# Patient Record
Sex: Female | Born: 1980 | Race: Black or African American | Hispanic: No | Marital: Married | Smoking: Never smoker
Health system: Southern US, Community
[De-identification: ages and names within clinical notes are randomized; demographics above are authoritative.]

## PROBLEM LIST (undated history)

## (undated) ENCOUNTER — Inpatient Hospital Stay (HOSPITAL_COMMUNITY): Payer: Self-pay

## (undated) DIAGNOSIS — K900 Celiac disease: Secondary | ICD-10-CM

## (undated) DIAGNOSIS — I10 Essential (primary) hypertension: Secondary | ICD-10-CM

## (undated) DIAGNOSIS — O24419 Gestational diabetes mellitus in pregnancy, unspecified control: Secondary | ICD-10-CM

## (undated) DIAGNOSIS — K219 Gastro-esophageal reflux disease without esophagitis: Secondary | ICD-10-CM

## (undated) DIAGNOSIS — K589 Irritable bowel syndrome without diarrhea: Secondary | ICD-10-CM

## (undated) DIAGNOSIS — M419 Scoliosis, unspecified: Secondary | ICD-10-CM

## (undated) DIAGNOSIS — N83209 Unspecified ovarian cyst, unspecified side: Secondary | ICD-10-CM

## (undated) DIAGNOSIS — Q270 Congenital absence and hypoplasia of umbilical artery: Secondary | ICD-10-CM

## (undated) DIAGNOSIS — N883 Incompetence of cervix uteri: Secondary | ICD-10-CM

## (undated) DIAGNOSIS — J45909 Unspecified asthma, uncomplicated: Secondary | ICD-10-CM

## (undated) DIAGNOSIS — D219 Benign neoplasm of connective and other soft tissue, unspecified: Secondary | ICD-10-CM

## (undated) DIAGNOSIS — G43909 Migraine, unspecified, not intractable, without status migrainosus: Secondary | ICD-10-CM

## (undated) HISTORY — DX: Gestational diabetes mellitus in pregnancy, unspecified control: O24.419

## (undated) HISTORY — DX: Congenital absence and hypoplasia of umbilical artery: Q27.0

## (undated) HISTORY — DX: Scoliosis, unspecified: M41.9

## (undated) HISTORY — DX: Celiac disease: K90.0

## (undated) HISTORY — DX: Gastro-esophageal reflux disease without esophagitis: K21.9

## (undated) HISTORY — DX: Benign neoplasm of connective and other soft tissue, unspecified: D21.9

## (undated) HISTORY — PX: NO PAST SURGERIES: SHX2092

## (undated) HISTORY — DX: Essential (primary) hypertension: I10

## (undated) HISTORY — DX: Irritable bowel syndrome, unspecified: K58.9

## (undated) HISTORY — DX: Unspecified asthma, uncomplicated: J45.909

## (undated) HISTORY — DX: Migraine, unspecified, not intractable, without status migrainosus: G43.909

## (undated) HISTORY — DX: Unspecified ovarian cyst, unspecified side: N83.209

---

## 2004-02-10 ENCOUNTER — Emergency Department (HOSPITAL_COMMUNITY): Admission: EM | Admit: 2004-02-10 | Discharge: 2004-02-10 | Payer: Self-pay | Admitting: Emergency Medicine

## 2004-02-14 ENCOUNTER — Emergency Department (HOSPITAL_COMMUNITY): Admission: EM | Admit: 2004-02-14 | Discharge: 2004-02-14 | Payer: Self-pay | Admitting: Emergency Medicine

## 2004-04-02 ENCOUNTER — Emergency Department (HOSPITAL_COMMUNITY): Admission: EM | Admit: 2004-04-02 | Discharge: 2004-04-02 | Payer: Self-pay | Admitting: Emergency Medicine

## 2004-04-05 ENCOUNTER — Emergency Department (HOSPITAL_COMMUNITY): Admission: EM | Admit: 2004-04-05 | Discharge: 2004-04-05 | Payer: Self-pay | Admitting: Emergency Medicine

## 2004-08-22 ENCOUNTER — Emergency Department (HOSPITAL_COMMUNITY): Admission: EM | Admit: 2004-08-22 | Discharge: 2004-08-22 | Payer: Self-pay | Admitting: Emergency Medicine

## 2006-02-05 ENCOUNTER — Other Ambulatory Visit: Admission: RE | Admit: 2006-02-05 | Discharge: 2006-02-05 | Payer: Self-pay | Admitting: Obstetrics and Gynecology

## 2006-03-19 ENCOUNTER — Inpatient Hospital Stay (HOSPITAL_COMMUNITY): Admission: AD | Admit: 2006-03-19 | Discharge: 2006-03-19 | Payer: Self-pay | Admitting: Obstetrics and Gynecology

## 2006-03-20 ENCOUNTER — Inpatient Hospital Stay (HOSPITAL_COMMUNITY): Admission: AD | Admit: 2006-03-20 | Discharge: 2006-03-20 | Payer: Self-pay | Admitting: Obstetrics and Gynecology

## 2006-03-25 ENCOUNTER — Inpatient Hospital Stay (HOSPITAL_COMMUNITY): Admission: AD | Admit: 2006-03-25 | Discharge: 2006-03-28 | Payer: Self-pay | Admitting: Obstetrics and Gynecology

## 2006-04-24 ENCOUNTER — Inpatient Hospital Stay (HOSPITAL_COMMUNITY): Admission: AD | Admit: 2006-04-24 | Discharge: 2006-04-25 | Payer: Self-pay | Admitting: Obstetrics and Gynecology

## 2006-04-24 ENCOUNTER — Encounter (INDEPENDENT_AMBULATORY_CARE_PROVIDER_SITE_OTHER): Payer: Self-pay | Admitting: *Deleted

## 2006-12-31 ENCOUNTER — Emergency Department (HOSPITAL_COMMUNITY): Admission: EM | Admit: 2006-12-31 | Discharge: 2006-12-31 | Payer: Self-pay | Admitting: Emergency Medicine

## 2007-01-23 ENCOUNTER — Encounter (INDEPENDENT_AMBULATORY_CARE_PROVIDER_SITE_OTHER): Payer: Self-pay | Admitting: *Deleted

## 2007-01-23 ENCOUNTER — Observation Stay (HOSPITAL_COMMUNITY): Admission: AD | Admit: 2007-01-23 | Discharge: 2007-01-23 | Payer: Self-pay | Admitting: Obstetrics and Gynecology

## 2009-06-29 ENCOUNTER — Ambulatory Visit (HOSPITAL_COMMUNITY): Admission: RE | Admit: 2009-06-29 | Discharge: 2009-06-29 | Payer: Self-pay | Admitting: Obstetrics and Gynecology

## 2009-11-14 ENCOUNTER — Emergency Department (HOSPITAL_COMMUNITY): Admission: EM | Admit: 2009-11-14 | Discharge: 2009-11-14 | Payer: Self-pay | Admitting: Emergency Medicine

## 2010-02-15 ENCOUNTER — Emergency Department (HOSPITAL_COMMUNITY): Admission: EM | Admit: 2010-02-15 | Discharge: 2010-02-15 | Payer: Self-pay | Admitting: Emergency Medicine

## 2010-08-18 ENCOUNTER — Emergency Department (HOSPITAL_COMMUNITY): Admission: EM | Admit: 2010-08-18 | Discharge: 2010-08-18 | Payer: Self-pay | Admitting: Family Medicine

## 2011-01-08 ENCOUNTER — Encounter: Payer: Self-pay | Admitting: Obstetrics and Gynecology

## 2011-03-01 LAB — POCT I-STAT, CHEM 8
BUN: 10 mg/dL (ref 6–23)
Calcium, Ion: 1.14 mmol/L (ref 1.12–1.32)
Chloride: 106 mEq/L (ref 96–112)
Creatinine, Ser: 0.7 mg/dL (ref 0.4–1.2)
Glucose, Bld: 92 mg/dL (ref 70–99)
HCT: 45 % (ref 36.0–46.0)
Hemoglobin: 15.3 g/dL — ABNORMAL HIGH (ref 12.0–15.0)
Potassium: 3.6 mEq/L (ref 3.5–5.1)
Sodium: 139 mEq/L (ref 135–145)
TCO2: 25 mmol/L (ref 0–100)

## 2011-03-01 LAB — POCT PREGNANCY, URINE: Preg Test, Ur: NEGATIVE

## 2011-03-01 LAB — POCT URINALYSIS DIPSTICK
Bilirubin Urine: NEGATIVE
Glucose, UA: NEGATIVE mg/dL
Hgb urine dipstick: NEGATIVE
Ketones, ur: NEGATIVE mg/dL
Nitrite: NEGATIVE
Protein, ur: NEGATIVE mg/dL
Specific Gravity, Urine: 1.015 (ref 1.005–1.030)
Urobilinogen, UA: 0.2 mg/dL (ref 0.0–1.0)
pH: 7 (ref 5.0–8.0)

## 2011-03-21 LAB — RAPID STREP SCREEN (MED CTR MEBANE ONLY): Streptococcus, Group A Screen (Direct): POSITIVE — AB

## 2011-03-21 LAB — PREGNANCY, URINE: Preg Test, Ur: NEGATIVE

## 2011-05-04 NOTE — Discharge Summary (Signed)
NAME:  Kathryn Terry, Kathryn Terry NO.:  [id number]   MEDICAL RECORD NO.:  [id number]          PATIENT TYPE:  INP   LOCATION:  9149                          [facility]ILITY:  [facility]   PHYSICIAN:  Zenaida Niece, M.D.DATE OF BIRTH:  May 05, 1981   DATE OF ADMISSION:  03/25/2006  DATE OF DISCHARGE:  03/28/2006                                 DISCHARGE SUMMARY   ADMISSION DIAGNOSES:  1.  Intrauterine pregnancy at 25+ weeks.  2.  Preterm cervical dilation.   DISCHARGE DIAGNOSES:  1.  Intrauterine pregnancy at 25+ weeks.  2.  Preterm cervical dilation.   HISTORY AND PHYSICAL:  Please see chart for full history and physical, but,  briefly, this is a 30 year old black female gravida 1, para 0 with an EGA of  25+ weeks who was seen on March 19, 2006 and then examined by Dr. Senaida Ores.  At that time, she was 1 cm dilated, 30% effaced.  She was sent home on  bedrest and received betamethasone.  On speculum exam on the day of  admission, Dr. Ambrose Mantle was able to see membranes at the cervical os, and the  cervix was about 2 cm dilated, 50% effaced.   PAST MEDICAL HISTORY:  1.  History of irritable bowel syndrome.  2.  Gastroesophageal reflux disease.   PHYSICAL EXAMINATION:  The remainder is significant for a gravid abdomen  with a fundal height of 26 cm and normal fetal heart sounds.   HOSPITAL COURSE:  The patient was admitted and had an ultrasound which  revealed vertex presentation.  She was put on Procardia for tocolysis.  She  was having at least irritability on monitoring.  Fetal heart tracing  remained reassuring.  Urine culture was sent and returned negative.  Group B  strep was done in the office, and I do not have those results.  She was put  on IV penicillin initially, and this was stopped which she did not progress.  Her contractions quiesced.  On the morning of March 28, 2006, I did an exam  and the cervix was 1 cm dilated, fairly thick, and the presenting part was  high with  no membranes palpable anywhere in the cervix.  Due the fact that  she does not appear to be progressing, I feel she is stable enough to go  home.   PLAN:  The plan is to let her go home on modified bed rest and follow-up in  2 weeks.  The patient is to call for anything that changes.   DISCHARGE INSTRUCTIONS:  1.  Regular diet.  2.  Modified bed rest.  3.  Follow-up in 2 weeks.  4.  Call for problems.      Zenaida Niece, M.D.  Electronically Signed     TDM/MEDQ  D:  03/28/2006  T:  03/28/2006  Job:  [id number]

## 2011-05-04 NOTE — Discharge Summary (Signed)
NAME:  Kathryn Terry, Kathryn Terry NO.:  [id number]   MEDICAL RECORD NO.:  [id number]          PATIENT TYPE:  OBV   LOCATION:  9373                          [facility]   PHYSICIAN:  Zenaida Niece, M.D.DATE OF BIRTH:  09/15/1981   DATE OF ADMISSION:  01/22/2007  DATE OF DISCHARGE:  01/23/2007                               DISCHARGE SUMMARY   ADMISSION DIAGNOSIS:  Intrauterine pregnancy at 18 weeks with preterm  premature rupture of membranes and inevitable abortion.   DISCHARGE DIAGNOSIS:  Intrauterine pregnancy at 18 weeks with preterm  premature rupture of membranes and inevitable abortion.   PROCEDURES:  On 02/07, she had a prostaglandin-induced vaginal delivery  of a nonviable infant.   HISTORY AND PHYSICAL:  Please see chart for full history and physical.  Briefly, this is a 30 year old black female, gravida 2, para 0-1-0-1,  with an EGA of approximately 18 weeks who presented with the complaint  of lower abdominal cramping and pouch at the opening of her vagina that  ruptured and spilled bloody fluid after which she noticed something came  out.  On evaluation in triage, she had a 20-week size uterus, and  ultrasound confirmed an 18-week pregnancy with decreased amniotic fluid  with a living pregnancy in a breech presentation.   Physical exam was significant for the that she was afebrile with stable  vital signs.  Fundus was just above the umbilicus and is nontender.  Pelvic exam revealed membranes hanging 6 inches outside the vaginal  introitus.  There was a blood clot in the vagina.  Cervix was fingertip  open, and Dr. Ambrose Mantle could palpate a leg as the presenting part.   HOSPITAL COURSE:  The patient was admitted and had prostaglandin  suppositories placed for induction.  She rapidly progressed, and on the  morning of February 7, spontaneously passed the fetus.  She was then  started on Pitocin.  Shortly after that, she passed 150-100 mL of blood  with the  placenta.  Dr. Ambrose Mantle felt it appeared intact.  Exam revealed  the cervix to be open, but no tissue was felt.  She was given 2 doses of  IV Unasyn and remained afebrile.  On the evening of February7, she was  felt to be stable enough for discharge home.   DISCHARGE INSTRUCTIONS:  Regular diet, pelvic rest, no strenuous  activity, and she is to follow-up in 2 weeks with Dr. Ambrose Mantle.      Zenaida Niece, M.D.  Electronically Signed     TDM/MEDQ  D:  03/04/2007  T:  03/05/2007  Job:  [id number]

## 2011-05-04 NOTE — H&P (Signed)
NAME:  Kathryn Terry, Kathryn Terry NO.:  [id number]   MEDICAL RECORD NO.:  [id number]          PATIENT TYPE:  MAT   LOCATION:  MATC                          [facility]   PHYSICIAN:  Malachi Pro. Ambrose Mantle, M.D. DATE OF BIRTH:  [birth year]   DATE OF ADMISSION:  01/23/2007  DATE OF DISCHARGE:                              HISTORY & PHYSICAL   PRESENT ILLNESS:  This is a 30 year old black female, para 0-1-0-1,  gravida 2, admitted with rupture of membranes, vaginal bleeding and  inevitable abortion.  Last menstrual period, December 10, 2006.  The  patient's 1st pregnancy was complicated by premature cervical dilatation  at approximately 26 weeks; she received steroids and then at 30 weeks,  delivered at 3-pound 7-1/2-ounce female infant on Apr 24, 2006.  At her 6-  week checkup, she was given Nora-B for birth control.  She did have  Chlamydia during her 1st pregnancy.  She has not been back for any  further checkups and had no idea that she was pregnant.  During the  evening of January 22, 2007, the patient noted some cramping in her  lower abdomen, went to the restroom, noted a pouch at the opening of her  vagina that ruptured and spilled bloody fluid.  She then noticed  something hanging out; she came here for evaluation and was noted to  have a uterus the size of a 20-week pregnancy, which on ultrasound was  shown to be average age of 18 weeks 1 or 2 days with decreased amniotic  fluid, the largest pocket measuring 0.7 cm.  The pregnancy was living  and was a breech presentation.  I saw the patient and there were  probably 20 mL of blood clot in the vagina.  The cervix was a fingertip-  open.   ALLERGIES:  No known allergies.   OPERATION:  No operations.   PAST MEDICAL HISTORY:  No significant illnesses, although she did have  Chlamydia during her 1st pregnancy.   SOCIAL HISTORY:  Occasional alcohol, no tobacco or drugs.   FAMILY HISTORY:  The mother is 60 with diabetes and  high blood pressure.  Father died of drowning at an unknown age.  One brother and 1 sister are  apparently living and well.   OBSTETRICAL HISTORY:  Apr 24, 2006, a 3-pound 7-1/2-ounce female at 30  weeks.   PHYSICAL EXAMINATION:  VITAL SIGNS:  Temperature 98.9, pulse 116,  respirations 20, blood pressure 129/82.  GENERAL:  Well-developed, well-nourished black female in no distress.  EENT:  Normal.  BREASTS:  Soft without masses.  HEART:  Normal, sinus tachycardia, no murmurs.  LUNGS:  Clear to auscultation.  ABDOMEN:  Soft.  Fundal height is just above the umbilicus.  PELVIC:  The uterus is not tender.  Vulva and vagina show membranous  material hanging about 6 inches outside the vaginal introitus.  There is  blood clot in the vagina, approximately 20 mL.  The cervix is a  fingertip-open.  I can touch a presenting part that is a leg.  The  uterus is approximately 20-week size.   ADMITTING IMPRESSION:  Intrauterine pregnancy, 18+ weeks by ultrasound,  with ruptured membranes and inevitable abortion.   PLAN:  The patient is admitted for prostaglandin suppositories.  Cultures are taken of the cervix for GC and Chlamydia of the endometrium  for anaerobic and aerobic.      Malachi Pro. Ambrose Mantle, M.D.  Electronically Signed     TFH/MEDQ  D:  01/23/2007  T:  01/23/2007  Job:  [id number]

## 2011-05-04 NOTE — Discharge Summary (Signed)
NAME:  Kathryn Terry, CREASON NO.:  [id number]   MEDICAL RECORD NO.:  [id number]          PATIENT TYPE:  INP   LOCATION:  9113                          [facility]   PHYSICIAN:  Huel Cote, M.D. DATE OF BIRTH:  1981-02-23   DATE OF ADMISSION:  04/24/2006  DATE OF DISCHARGE:  04/25/2006                                 DISCHARGE SUMMARY   DISCHARGE DIAGNOSES:  1.  Preterm pregnancy at 30 and 2/7ths weeks, delivered.  2.  Status post spontaneous vaginal delivery.  3.  Preterm status of infant requiring the NICU care.   DISCHARGE MEDICATIONS:  Motrin 600 mg p.o. every 6 hours p.r.n.   DISCHARGE FOLLOWUP:  The patient is to follow-up in the office in 6 weeks  for her routine postpartum exam.   HOSPITAL COURSE:  The patient is a 30 year old G1 P0 who was admitted to 76  and 2/7ths weeks gestation, arriving in active labor with advanced cervical  dilation to 7-8 cm.  Prenatal care had been complicated by preterm dilation  with admission to the hospital at 25-week.  She did receive betamethasone  and had been placed on bedrest for these findings.   PRENATAL LABORATORIES:  A positive, antibody negative, sickle normal,  rubella immune, hepatitis B surface antigen negative, HIV negative, GC  negative, Chlamydia positive with a negative test of cure after treatment,  triple screen normal, group B strep unknown.  One-hour Glucola was normal,  per the patient.   PAST OBSTETRICAL HISTORY:  None.   PAST GYNECOLOGIC HISTORY:  The chlamydia, which was treated this pregnancy,  with a negative test of cure.   PAST MEDICAL HISTORY:  1.  Irritable bowel syndrome.  2.  Reflux disease.   PAST SURGICAL HISTORY:  None.   ALLERGIES:  She had no known drug allergies.   HOSPITAL COURSE:  On admission, she was afebrile with stable vital signs.  Fetal heart rate was approximately 160s.  Cervix was found to be 9 cm  shortly after arrival to labor and delivery.  She was given  ampicillin for  positive preterm status and questionable group B strep status.  She  progressed to complete dilation within minutes and pushed well with a normal  spontaneous vaginal delivery of a viable female infant over an intact  perineum.  Apgars were 8/8.  Weight was 3 pounds 8 ounces.  Placenta  delivered spontaneously and appeared normal, but was sent to pathology and  culture was done.  Cervix and rectum were intact.  NICU was in attendance,  and the baby was taken after delivery to the NICU for some retracting.  The  patient did well.  On postpartum day #1, she requested early  discharge as she was graduating from college and wanted to attend the  ceremony.  She was getting a breast pump through [facility], and had been contacted  by the NICU social worker with close follow-up planned.  She will follow up  in office in 6 weeks for her routine postpartum exam.      Huel Cote, M.D.  Electronically Signed     KR/MEDQ  D:  05/13/2006  T:  05/13/2006  Job:  [id number]

## 2011-05-04 NOTE — H&P (Signed)
NAME:  Kathryn Terry, RIVERS NO.:  [id number]   MEDICAL RECORD NO.:  [id number]          PATIENT TYPE:  MAT   LOCATION:                                [facility]   PHYSICIAN:  Malachi Pro. Ambrose Mantle, M.D. DATE OF BIRTH:  1981/02/23   DATE OF ADMISSION:  03/25/2006  DATE OF DISCHARGE:                                HISTORY & PHYSICAL   HISTORY OF PRESENT ILLNESS:  30 year old black single female, para 0,  gravida 1, EDC July 01, 2006 by ultrasound admitted with premature cervical  dilatation.  Blood group and type A positive, negative antibody, sickle cell  negative, RPR nonreactive, Rubella immune, hepatitis B surface antigen  negative, HIV negative six to seven months ago.  Urinalysis negative.  Triple screen normal.  Gonorrhea negative, Chlamydia positive.  The patient  was seen initially on February 05, 2006 after an ultrasound on February 01, 2006 showed an average gestational age of [redacted] weeks, 4 days with [facility] of July 01, 2006.  The positive culture for Chlamydia was treated with azithromycin  and a recheck for Chlamydia was apparently done on March 19, 2006, the  results of which are not available at this point.  The patient was seen on  March 19, 2006 and the cervix was 1 cm, 30%.  Dr. Senaida Ores advised the  patient to begin pelvic rest and bedrest.  She did receive 2 doses of beta  Methasone. She returned today for group B Strep testing, fetal fibronectin  and recheck of the cervix, however, on speculum examination the membranes  were seen in the cervical os.  The patient denies any uterine contractions.   PAST MEDICAL HISTORY:  She has a history of irritable bowel syndrome and  gastroesophageal reflux disease but no current problems.   ALLERGIES:  No known allergies.   PAST SURGICAL HISTORY:  She has had no operations.   SOCIAL HISTORY:  She does not drink, smoke or take drugs.   FAMILY HISTORY:  Mother with diabetes.  Maternal grandmother with thyroid  problems, high blood pressure.  Maternal aunt breast cancer and also  maternal aunt on dialysis.   OBSTETRICAL HISTORY:  Negative.   PHYSICAL EXAMINATION:  GENERAL APPEARANCE:  Physical examination reveals a  well-developed, well-nourished black female in no distress.  VITAL SIGNS:  Blood pressure 112/72, pulse 80, weight 129-1/4 pounds.  HEENT:  No evidence of abnormalities.  NECK:  Supple without thyromegaly.  HEART:  Normal size and sounds, no murmurs.  LUNGS:  Clear to auscultation.  BREASTS:  Not examined.  ABDOMEN:  Soft, fundal height 26 cm.  Fetal heart tones normal.  PELVIC:  Cervix is dilated to 2 cm, thought to be 50%, cannot tell the  presenting part.   ADMISSION IMPRESSION:  Intrauterine pregnancy, 25 weeks, 6 days, with  premature cervical dilatation, possible incompetent cervix.   PLAN:  Patient is admitted to antenatal for monitoring, for antibiotics -  Penicillin, to cover group B Strep until it is proven negative.  Group B  Strep culture done today.  It should be noted that the Chlamydia check  on  March 19, 2006 was negative.      Malachi Pro. Ambrose Mantle, M.D.  Electronically Signed     TFH/MEDQ  D:  03/25/2006  T:  03/25/2006  Job:  [id number]

## 2011-06-07 ENCOUNTER — Emergency Department (HOSPITAL_COMMUNITY): Payer: Worker's Compensation

## 2011-06-07 ENCOUNTER — Emergency Department (HOSPITAL_COMMUNITY)
Admission: EM | Admit: 2011-06-07 | Discharge: 2011-06-07 | Disposition: A | Discharge status: Home / Self Care | Payer: Worker's Compensation | Attending: Emergency Medicine | Admitting: Emergency Medicine

## 2011-06-07 DIAGNOSIS — F411 Generalized anxiety disorder: Secondary | ICD-10-CM | POA: Insufficient documentation

## 2011-06-07 DIAGNOSIS — R071 Chest pain on breathing: Secondary | ICD-10-CM | POA: Insufficient documentation

## 2011-06-07 DIAGNOSIS — M25569 Pain in unspecified knee: Secondary | ICD-10-CM | POA: Insufficient documentation

## 2011-06-07 DIAGNOSIS — R51 Headache: Secondary | ICD-10-CM | POA: Insufficient documentation

## 2012-11-18 LAB — OB RESULTS CONSOLE HIV ANTIBODY (ROUTINE TESTING): HIV: NONREACTIVE

## 2012-11-18 LAB — OB RESULTS CONSOLE HEPATITIS B SURFACE ANTIGEN: Hepatitis B Surface Ag: NEGATIVE

## 2012-11-18 LAB — OB RESULTS CONSOLE RPR: RPR: NONREACTIVE

## 2012-11-18 LAB — OB RESULTS CONSOLE ABO/RH: RH Type: POSITIVE

## 2012-11-18 LAB — OB RESULTS CONSOLE GC/CHLAMYDIA
Chlamydia: NEGATIVE
Gonorrhea: NEGATIVE

## 2012-11-18 LAB — OB RESULTS CONSOLE ANTIBODY SCREEN: Antibody Screen: NEGATIVE

## 2012-11-18 LAB — OB RESULTS CONSOLE RUBELLA ANTIBODY, IGM: Rubella: IMMUNE

## 2012-12-02 ENCOUNTER — Encounter (HOSPITAL_COMMUNITY): Payer: Self-pay | Admitting: Pharmacist

## 2012-12-15 ENCOUNTER — Encounter (HOSPITAL_COMMUNITY): Admission: RE | Payer: Self-pay | Source: Ambulatory Visit

## 2012-12-15 ENCOUNTER — Ambulatory Visit (HOSPITAL_COMMUNITY)
Admission: RE | Admit: 2012-12-15 | Payer: BC Managed Care – PPO | Source: Ambulatory Visit | Admitting: Obstetrics and Gynecology

## 2012-12-15 SURGERY — CERCLAGE, CERVIX, VAGINAL APPROACH
Anesthesia: General

## 2012-12-17 NOTE — L&D Delivery Note (Signed)
Delivery Note At 11:54 PM a viable female was delivered via Vaginal, Spontaneous Delivery (Presentation: ; Occiput Anterior).  APGAR: 8, 9; weight pending.   Placenta status: Intact, Spontaneous.  Cord:  with the following complications: None.  Anesthesia: None  Episiotomy: None Lacerations: None Suture Repair: none Est. Blood Loss (mL): 300  Mom to postpartum.  Baby to stay with mom.  Regis Hinton D 05/18/2013, 12:05 AM

## 2013-04-23 ENCOUNTER — Inpatient Hospital Stay (HOSPITAL_COMMUNITY)
Admission: AD | Admit: 2013-04-23 | Discharge: 2013-04-23 | Disposition: A | Discharge status: Home / Self Care | Payer: BC Managed Care – PPO | Source: Ambulatory Visit | Attending: Obstetrics and Gynecology | Admitting: Obstetrics and Gynecology

## 2013-04-23 DIAGNOSIS — O4700 False labor before 37 completed weeks of gestation, unspecified trimester: Secondary | ICD-10-CM | POA: Insufficient documentation

## 2013-04-23 MED ORDER — BETAMETHASONE SOD PHOS & ACET 6 (3-3) MG/ML IJ SUSP
12.0000 mg | Freq: Once | INTRAMUSCULAR | Status: AC
  Administered 2013-04-23: 12 mg via INTRAMUSCULAR
  Filled 2013-04-23: qty 2

## 2013-04-24 ENCOUNTER — Inpatient Hospital Stay (HOSPITAL_COMMUNITY)
Admission: AD | Admit: 2013-04-24 | Discharge: 2013-04-24 | Disposition: A | Discharge status: Home / Self Care | Payer: BC Managed Care – PPO | Source: Ambulatory Visit | Attending: Obstetrics and Gynecology | Admitting: Obstetrics and Gynecology

## 2013-04-24 ENCOUNTER — Encounter (HOSPITAL_COMMUNITY): Payer: Self-pay | Admitting: *Deleted

## 2013-04-24 DIAGNOSIS — O4700 False labor before 37 completed weeks of gestation, unspecified trimester: Secondary | ICD-10-CM | POA: Insufficient documentation

## 2013-04-24 MED ORDER — BETAMETHASONE SOD PHOS & ACET 6 (3-3) MG/ML IJ SUSP
12.0000 mg | Freq: Once | INTRAMUSCULAR | Status: AC
  Administered 2013-04-24: 12 mg via INTRAMUSCULAR
  Filled 2013-04-24: qty 2

## 2013-05-14 LAB — OB RESULTS CONSOLE GBS: GBS: NEGATIVE

## 2013-05-17 ENCOUNTER — Encounter (HOSPITAL_COMMUNITY): Payer: Self-pay | Admitting: *Deleted

## 2013-05-17 ENCOUNTER — Inpatient Hospital Stay (HOSPITAL_COMMUNITY)
Admission: AD | Admit: 2013-05-17 | Discharge: 2013-05-19 | DRG: 372 | Disposition: A | Discharge status: Home / Self Care | Payer: BC Managed Care – PPO | Source: Ambulatory Visit | Attending: Obstetrics and Gynecology | Admitting: Obstetrics and Gynecology

## 2013-05-17 DIAGNOSIS — O4290 Premature rupture of membranes, unspecified as to length of time between rupture and onset of labor, unspecified weeks of gestation: Principal | ICD-10-CM | POA: Diagnosis present

## 2013-05-17 HISTORY — DX: Incompetence of cervix uteri: N88.3

## 2013-05-17 LAB — CBC
HCT: 31.6 % — ABNORMAL LOW (ref 36.0–46.0)
Hemoglobin: 11 g/dL — ABNORMAL LOW (ref 12.0–15.0)
MCH: 30.6 pg (ref 26.0–34.0)
MCHC: 34.8 g/dL (ref 30.0–36.0)
MCV: 87.8 fL (ref 78.0–100.0)
Platelets: 124 10*3/uL — ABNORMAL LOW (ref 150–400)
RBC: 3.6 MIL/uL — ABNORMAL LOW (ref 3.87–5.11)
RDW: 12.9 % (ref 11.5–15.5)
WBC: 7.9 10*3/uL (ref 4.0–10.5)

## 2013-05-17 LAB — AMNISURE RUPTURE OF MEMBRANE (ROM) NOT AT ARMC: Amnisure ROM: POSITIVE

## 2013-05-17 LAB — RPR: RPR Ser Ql: NONREACTIVE

## 2013-05-17 MED ORDER — OXYCODONE-ACETAMINOPHEN 5-325 MG PO TABS: 1.0000 | ORAL_TABLET | ORAL | Status: DC | PRN

## 2013-05-17 MED ORDER — BUTORPHANOL TARTRATE 1 MG/ML IJ SOLN
1.0000 mg | INTRAMUSCULAR | Status: DC | PRN
  Administered 2013-05-17: 1 mg via INTRAVENOUS
  Filled 2013-05-17: qty 1

## 2013-05-17 MED ORDER — PHENYLEPHRINE 40 MCG/ML (10ML) SYRINGE FOR IV PUSH (FOR BLOOD PRESSURE SUPPORT): 80.0000 ug | PREFILLED_SYRINGE | INTRAVENOUS | Status: DC | PRN

## 2013-05-17 MED ORDER — LACTATED RINGERS IV SOLN
500.0000 mL | INTRAVENOUS | Status: DC | PRN
  Administered 2013-05-17: 300 mL via INTRAVENOUS

## 2013-05-17 MED ORDER — OXYTOCIN 40 UNITS IN LACTATED RINGERS INFUSION - SIMPLE MED
1.0000 m[IU]/min | INTRAVENOUS | Status: DC
  Administered 2013-05-17: 2 m[IU]/min via INTRAVENOUS
  Filled 2013-05-17: qty 1000

## 2013-05-17 MED ORDER — LACTATED RINGERS IV SOLN: 500.0000 mL | Freq: Once | INTRAVENOUS | Status: DC

## 2013-05-17 MED ORDER — CITRIC ACID-SODIUM CITRATE 334-500 MG/5ML PO SOLN: 30.0000 mL | ORAL | Status: DC | PRN

## 2013-05-17 MED ORDER — ACETAMINOPHEN 325 MG PO TABS: 650.0000 mg | ORAL_TABLET | ORAL | Status: DC | PRN

## 2013-05-17 MED ORDER — TERBUTALINE SULFATE 1 MG/ML IJ SOLN: 0.2500 mg | Freq: Once | INTRAMUSCULAR | Status: AC | PRN

## 2013-05-17 MED ORDER — EPHEDRINE 5 MG/ML INJ: 10.0000 mg | INTRAVENOUS | Status: DC | PRN

## 2013-05-17 MED ORDER — FENTANYL 2.5 MCG/ML BUPIVACAINE 1/10 % EPIDURAL INFUSION (WH - ANES): 14.0000 mL/h | INTRAMUSCULAR | Status: DC | PRN

## 2013-05-17 MED ORDER — ONDANSETRON HCL 4 MG/2ML IJ SOLN
4.0000 mg | Freq: Four times a day (QID) | INTRAMUSCULAR | Status: DC | PRN
  Administered 2013-05-17: 4 mg via INTRAVENOUS
  Filled 2013-05-17: qty 2

## 2013-05-17 MED ORDER — DIPHENHYDRAMINE HCL 50 MG/ML IJ SOLN: 12.5000 mg | INTRAMUSCULAR | Status: DC | PRN

## 2013-05-17 MED ORDER — IBUPROFEN 600 MG PO TABS
600.0000 mg | ORAL_TABLET | Freq: Four times a day (QID) | ORAL | Status: DC | PRN
  Administered 2013-05-18: 600 mg via ORAL
  Filled 2013-05-17: qty 1

## 2013-05-17 MED ORDER — OXYTOCIN BOLUS FROM INFUSION
500.0000 mL | INTRAVENOUS | Status: DC
  Administered 2013-05-17: 500 mL via INTRAVENOUS

## 2013-05-17 MED ORDER — OXYTOCIN 40 UNITS IN LACTATED RINGERS INFUSION - SIMPLE MED
62.5000 mL/h | INTRAVENOUS | Status: DC
  Administered 2013-05-18: 62.5 mL/h via INTRAVENOUS

## 2013-05-17 MED ORDER — LIDOCAINE HCL (PF) 1 % IJ SOLN: 30.0000 mL | INTRAMUSCULAR | Status: DC | PRN

## 2013-05-17 MED ORDER — LACTATED RINGERS IV SOLN
INTRAVENOUS | Status: DC
  Administered 2013-05-17 (×3): via INTRAVENOUS

## 2013-05-17 NOTE — MAU Note (Signed)
Leaking clear fld since 0300. Denies any pain. Hx PTL at 30wks. And demise at 18wks. Received 17P inj last one 5/29

## 2013-05-17 NOTE — Progress Notes (Signed)
Feeling some ctx, has not had pain medicine Afeb, VSS FHT- Cat I, ctx q 2-3 min on 16 mu/min pitocin VE- 4/70/-2, vtx, AROM forebag, IUPC inserted Will continue pitocin, monitor progress, hopefully getting into active labor

## 2013-05-17 NOTE — Progress Notes (Signed)
Rayfield Citizen RN in [facility] called and report given. Pt to 168 via w/c from Triage

## 2013-05-17 NOTE — H&P (Signed)
Kathryn Terry is a 32 y.o. female, G3 P0111, EGA [redacted] weeks with [facility] 6-29 presenting for evaluation of leaking fluid.  On initial eval in triage, unable to document ROM.  However, Amnisure was negative and she has continued to leak fluid.  Having irregular ctx.  Prenatal care complicated by h/o 30 week delivery followed by 18 week delivery with PPROM.  She was offered but declined cerclage, has received weekly 17-P, received course of betamethasone 3 weeks ago.  See prenatal records for complete history.  Maternal Medical History:  Reason for admission: Rupture of membranes.   Contractions: Frequency: irregular.   Perceived severity is mild.    Fetal activity: Perceived fetal activity is normal.      OB History   Grav Para Term Preterm Abortions TAB SAB Ect Mult Living   3 1  1 1  1   1      Past Medical History  Diagnosis Date  . Incompetent cervix    Past Surgical History  Procedure Laterality Date  . No past surgeries     Family History: family history is not on file. Social History:  reports that she has never smoked. She does not have any smokeless tobacco history on file. She reports that she does not drink alcohol or use illicit drugs.   Prenatal Transfer Tool  Maternal Diabetes: No Genetic Screening: Normal Maternal Ultrasounds/Referrals: Normal Fetal Ultrasounds or other Referrals:  None Maternal Substance Abuse:  No Significant Maternal Medications:  Meds include: Progesterone Other:  Significant Maternal Lab Results:  Lab values include: Group B Strep negative Other Comments:  PPROM  Review of Systems  Respiratory: Negative.   Cardiovascular: Negative.     Dilation: 2 Effacement (%): 50 Station: -3 Exam by:: Simona Huh, RN Blood pressure 102/59, pulse 84, temperature 98.8 F (37.1 C), temperature source Oral, resp. rate 18, height 5\' 1"  (1.549 m), weight 66.769 kg (147 lb 3.2 oz), SpO2 100.00%. Maternal Exam:  Uterine Assessment: Contraction strength is mild.   Contraction frequency is irregular.   Abdomen: Patient reports no abdominal tenderness. Estimated fetal weight is 6 lbs.   Fetal presentation: vertex  Introitus: Normal vulva. Normal vagina.  Ferning test: negative.  Amniotic fluid character: clear.  Pelvis: adequate for delivery.   Cervix: Cervix evaluated by digital exam.     Fetal Exam Fetal Monitor Review: Mode: ultrasound.   Variability: moderate (6-25 bpm).   Pattern: accelerations present and no decelerations.    Fetal State Assessment: Category I - tracings are normal.     Physical Exam  Constitutional: She appears well-developed and well-nourished.  Cardiovascular: Normal rate, regular rhythm and normal heart sounds.   No murmur heard. Respiratory: Effort normal and breath sounds normal. No respiratory distress. She has no wheezes.  GI: Soft.  gravid    Prenatal labs: ABO, Rh: A/Positive/-- (12/03 0000) Antibody: Negative (12/03 0000) Rubella: Immune (12/03 0000) RPR: Nonreactive (12/03 0000)  HBsAg: Negative (12/03 0000)  HIV: Non-reactive (12/03 0000)  GBS: Negative (05/29 0000)   Assessment/Plan: IUP at 36 weeks with PPROM, vertex by u/s, GBS neg.  Will augment with pitocin, monitor progress.   Terrelle Ruffolo D 05/17/2013, 9:37 AM

## 2013-05-18 ENCOUNTER — Encounter (HOSPITAL_COMMUNITY): Payer: Self-pay | Admitting: *Deleted

## 2013-05-18 MED ORDER — ZOLPIDEM TARTRATE 5 MG PO TABS: 5.0000 mg | ORAL_TABLET | Freq: Every evening | ORAL | Status: DC | PRN

## 2013-05-18 MED ORDER — WITCH HAZEL-GLYCERIN EX PADS: 1.0000 "application " | MEDICATED_PAD | CUTANEOUS | Status: DC | PRN

## 2013-05-18 MED ORDER — MEASLES, MUMPS & RUBELLA VAC ~~LOC~~ INJ
0.5000 mL | INJECTION | Freq: Once | SUBCUTANEOUS | Status: DC
  Filled 2013-05-18: qty 0.5

## 2013-05-18 MED ORDER — BENZOCAINE-MENTHOL 20-0.5 % EX AERO
1.0000 "application " | INHALATION_SPRAY | CUTANEOUS | Status: DC | PRN
  Administered 2013-05-18: 1 via TOPICAL
  Filled 2013-05-18 (×2): qty 56

## 2013-05-18 MED ORDER — SIMETHICONE 80 MG PO CHEW: 80.0000 mg | CHEWABLE_TABLET | ORAL | Status: DC | PRN

## 2013-05-18 MED ORDER — PRENATAL MULTIVITAMIN CH
1.0000 | ORAL_TABLET | Freq: Every day | ORAL | Status: DC
  Administered 2013-05-18: 1 via ORAL
  Filled 2013-05-18: qty 1

## 2013-05-18 MED ORDER — MAGNESIUM HYDROXIDE 400 MG/5ML PO SUSP: 30.0000 mL | ORAL | Status: DC | PRN

## 2013-05-18 MED ORDER — OXYCODONE-ACETAMINOPHEN 5-325 MG PO TABS: 1.0000 | ORAL_TABLET | ORAL | Status: DC | PRN

## 2013-05-18 MED ORDER — DIPHENHYDRAMINE HCL 25 MG PO CAPS: 25.0000 mg | ORAL_CAPSULE | Freq: Four times a day (QID) | ORAL | Status: DC | PRN

## 2013-05-18 MED ORDER — ONDANSETRON HCL 4 MG/2ML IJ SOLN: 4.0000 mg | INTRAMUSCULAR | Status: DC | PRN

## 2013-05-18 MED ORDER — METHYLERGONOVINE MALEATE 0.2 MG PO TABS: 0.2000 mg | ORAL_TABLET | ORAL | Status: DC | PRN

## 2013-05-18 MED ORDER — METHYLERGONOVINE MALEATE 0.2 MG/ML IJ SOLN: 0.2000 mg | INTRAMUSCULAR | Status: DC | PRN

## 2013-05-18 MED ORDER — TETANUS-DIPHTH-ACELL PERTUSSIS 5-2.5-18.5 LF-MCG/0.5 IM SUSP: 0.5000 mL | Freq: Once | INTRAMUSCULAR | Status: DC

## 2013-05-18 MED ORDER — ONDANSETRON HCL 4 MG PO TABS: 4.0000 mg | ORAL_TABLET | ORAL | Status: DC | PRN

## 2013-05-18 MED ORDER — LANOLIN HYDROUS EX OINT: TOPICAL_OINTMENT | CUTANEOUS | Status: DC | PRN

## 2013-05-18 MED ORDER — SENNOSIDES-DOCUSATE SODIUM 8.6-50 MG PO TABS
2.0000 | ORAL_TABLET | Freq: Every day | ORAL | Status: DC
  Administered 2013-05-18: 2 via ORAL

## 2013-05-18 MED ORDER — IBUPROFEN 600 MG PO TABS
600.0000 mg | ORAL_TABLET | Freq: Four times a day (QID) | ORAL | Status: DC
  Administered 2013-05-18 (×4): 600 mg via ORAL
  Filled 2013-05-18 (×4): qty 1

## 2013-05-18 MED ORDER — DIBUCAINE 1 % RE OINT
1.0000 "application " | TOPICAL_OINTMENT | RECTAL | Status: DC | PRN
  Filled 2013-05-18: qty 28

## 2013-05-18 NOTE — Lactation Note (Signed)
This note was copied from the chart of Kathryn Terry. Lactation Consultation Note Initial consultation for this 36 week baby. Mom has h/o delivery at 30 weeks, baby in NICU for one month, is now 32 years old. Mom pumped for that baby, then breast fed him for a total of 3 months, states she had abundant milk supply.  At this time, mom states br feeding is going well, states baby has had one good latch (baby is now 15 hours old), states she could hear swallows, and was comfortable. Baby is in bassinet, starting to wiggle just slightly, offered to assist with a feeding, mom accepts. Mom is competent to get baby positioned, and to hand express some colostrum, and to help baby latch. However, baby was very sleepy and did not latch to the breast.  Reviewed br feeding basics and discussed specifically the considerations of breast feeding a late-preterm infant. Enc frequent STS and cue based feeding, waking baby every 3 hours if she is sleeping, attempting to br feed 8 to 12 times or more per day.  Enc mom to call when baby is ready to feed, direct number provided.  Lactation brochure provided for mom, and mom made aware of breastfeeding services and BFSG. Questions answered.   Patient Name: Kathryn Terry [id number] Date: 05/18/2013 Reason for consult: Initial assessment;Infant < 6lbs;Late preterm infant   Maternal Data Formula Feeding for Exclusion: No Has patient been taught Hand Expression?: Yes Does the patient have breastfeeding experience prior to this delivery?: Yes  Feeding Feeding Type: Breast Milk Feeding method: Breast Length of feed: 0 min  LATCH Score/Interventions Latch: Too sleepy or reluctant, no latch achieved, no sucking elicited. Intervention(s): Skin to skin;Teach feeding cues;Waking techniques Intervention(s): Breast massage;Breast compression  Audible Swallowing: None  Type of Nipple: Everted at rest and after stimulation  Comfort (Breast/Nipple): Soft /  non-tender     Hold (Positioning): No assistance needed to correctly position infant at breast. Intervention(s): Breastfeeding basics reviewed;Support Pillows;Position options;Skin to skin  LATCH Score: 6  Lactation Tools Discussed/Used     Consult Status Consult Status: Follow-up Follow-up type: In-patient    Octavio Manns [facility] 05/18/2013, 1:45 PM

## 2013-05-18 NOTE — Progress Notes (Signed)
PPD #1 No problems Afeb, VSS Fundus firm, NT at U-1 Continue routine postpartum care 

## 2013-05-19 NOTE — Discharge Summary (Signed)
Obstetric Discharge Summary Reason for Admission: onset of labor and rupture of membranes Prenatal Procedures: none Intrapartum Procedures: spontaneous vaginal delivery Postpartum Procedures: none Complications-Operative and Postpartum: none Hemoglobin  Date Value Range Status  05/17/2013 11.0* 12.0 - 15.0 g/dL Final     HCT  Date Value Range Status  05/17/2013 31.6* 36.0 - 46.0 % Final    Physical Exam:  General: alert Lochia: appropriate Uterine Fundus: firm  Discharge Diagnoses: PPROM and SVD  Discharge Information: Date: 05/19/2013 Activity: pelvic rest Diet: routine Medications: Ibuprofen Condition: stable Instructions: refer to practice specific booklet Discharge to: home Follow-up Information   Follow up with Shaleigh Laubscher D, MD. Schedule an appointment as soon as possible for a visit in 6 weeks.   Contact information:   [address] [place] [address] [phone]       Newborn Data: Live born female  Birth Weight: 5 lb 14.3 oz (2673 g) APGAR: 8, 9  Home with mother.  Shadi Larner D 05/19/2013, 8:13 AM

## 2013-05-19 NOTE — Progress Notes (Signed)
PPD #2 No problems Afeb, VSS Fundus firm D/c home 

## 2014-10-18 ENCOUNTER — Encounter (HOSPITAL_COMMUNITY): Payer: Self-pay | Admitting: *Deleted

## 2015-09-15 DIAGNOSIS — R1115 Cyclical vomiting syndrome unrelated to migraine: Secondary | ICD-10-CM | POA: Insufficient documentation

## 2015-10-27 DIAGNOSIS — K911 Postgastric surgery syndromes: Secondary | ICD-10-CM | POA: Insufficient documentation

## 2016-03-08 LAB — HM COLONOSCOPY

## 2016-10-01 LAB — OB RESULTS CONSOLE ANTIBODY SCREEN: Antibody Screen: NEGATIVE

## 2016-10-01 LAB — OB RESULTS CONSOLE HIV ANTIBODY (ROUTINE TESTING): HIV: NONREACTIVE

## 2016-10-01 LAB — OB RESULTS CONSOLE ABO/RH: RH Type: POSITIVE

## 2016-10-01 LAB — OB RESULTS CONSOLE GC/CHLAMYDIA
Chlamydia: NEGATIVE
Gonorrhea: NEGATIVE

## 2016-10-01 LAB — OB RESULTS CONSOLE HEPATITIS B SURFACE ANTIGEN: Hepatitis B Surface Ag: NEGATIVE

## 2016-10-01 LAB — OB RESULTS CONSOLE RPR: RPR: NONREACTIVE

## 2016-10-01 LAB — OB RESULTS CONSOLE RUBELLA ANTIBODY, IGM: Rubella: IMMUNE

## 2016-12-17 NOTE — L&D Delivery Note (Signed)
Operative Delivery Note Pt reached complete dilation and because she had no anesthesia, wanted to push.  She pushed off and on iin various positions for about 1 hour with the baby OP most of the time.  She finally was able to bring the vertex to a +3 station and spontaneous rotate the baby to LOA.  A severe shoulder dystocia was then encountered and took 3 minutes to resolve.  At 12:41 PM a viable female was delivered via Vaginal, Spontaneous Delivery.  Presentation: vertex; Position: Left,, Occiput,, Anterior; Station: +3.  Delivery of the head: 04/29/2017 12:39 PM First maneuver: 04/29/2017 12:39 PM, McRoberts Suprapubic Pressure Second maneuver: 04/29/2017 12:40 PM, Posterior right  shoulder rotated anteriorly Third maneuver: 04/29/2017 12:41 PM, Pt placed on all fours and anterior shoulder was then able to be dislodged and baby delivered.  No traction was placed on vertex at any point.  NICU called and in attendance, baby was floppy but then recovered quickly and was able to transition to skin-to-skin        APGAR: 4, 8; weight pending .   Placenta status:delivered spontaneously .   Cord:  with the following complications:none .  Cord pH: 7.18  Anesthesia:  1% lidocaine local block Episiotomy: None Lacerations: 3rd degree Suture Repair:  3-0 vicryl to reinforce the rectal mucosa,  0-vicryl to repair the rectal sphincter in overlapping fashion, 2-0 and 3-0 vicryl to repair the remaining vaginal and perineal laceration.   Est. Blood Loss (mL): 327mL  Mom to postpartum.  Baby to Couplet care / Skin to Skin. At the conclusion of the repair baby had tone in left arm, but was not lifting it up.  Discussed with pt and husband, will follow.  D/w pt circumcision and will decide on here or office.    Logan Bores 04/29/2017, 1:56 PM

## 2017-01-23 LAB — HM PAP SMEAR

## 2017-04-05 LAB — OB RESULTS CONSOLE GBS: GBS: NEGATIVE

## 2017-04-22 ENCOUNTER — Telehealth (HOSPITAL_COMMUNITY): Payer: Self-pay | Admitting: *Deleted

## 2017-04-22 ENCOUNTER — Encounter (HOSPITAL_COMMUNITY): Payer: Self-pay | Admitting: *Deleted

## 2017-04-22 NOTE — Telephone Encounter (Signed)
Preadmission screen  

## 2017-04-28 NOTE — H&P (Signed)
Kathryn Terry is a 36 y.o. female [id number] at 85 2/7 weeks (EDD 05/04/17 by LMP c/w 9 week [place]) presenting for IOL at term.   Prenatal care complicated by h/o PTD x 3.   One delivery was prior to viability at 18 weeks, and the other two were at 30 and 36 weeks.  She received 17-P weeks 15-36 this pregnancy.  This pregnancy also significant for isolated Ducor on [place] and followed for growth with [place] given she also has a fibroid uterus which made fundal heights inaccurate.  Last growth [place] 04/16/17 with EFW 39%ile BPD lagging at <5%ile but AFI WNL, Dopplers WNL.    OB History    Gravida Para Term Preterm AB Living   4 2   2 1 2    SAB TAB Ectopic Multiple Live Births   1       2    2007 NSVD 3#8oz 30 weeks, PROM 2008 NSVD PROM and fetal loss at 18 weeks 2014 NSVD 36 weeks 5#14oz  Past Medical History:  Diagnosis Date  . Cyst of ovary   . GERD (gastroesophageal reflux disease)   . IBS (irritable bowel syndrome)   . Single umbilical artery    No past surgical history on file. Family History: family history is not on file. Social History:  has no tobacco, alcohol, and drug history on file.     Maternal Diabetes: No Genetic Screening: Normal Maternal Ultrasounds/Referrals: Abnormal:  Findings:   Other:2vessel cord, isolated Fetal Ultrasounds or other Referrals:  None Maternal Substance Abuse:  No Significant Maternal Medications:  None except 17-P Significant Maternal Lab Results:  None Other Comments:  None  ROS Maternal Medical History:  Contractions: Frequency: irregular.   Perceived severity is mild.    Fetal activity: Perceived fetal activity is normal.    Prenatal complications: Placental abnormality ([place]).   Prenatal Complications - Diabetes: none.      Last menstrual period 07/28/2016. Maternal Exam:  Uterine Assessment: Contraction strength is mild.  Contraction frequency is irregular.   Abdomen: Patient reports no abdominal tenderness. Fetal presentation:  vertex  Introitus: Normal vulva. Normal vagina.    Physical Exam  Constitutional: She appears well-developed and well-nourished.  Cardiovascular: Normal rate and regular rhythm.   Respiratory: Effort normal.  GI: Soft.  Genitourinary: Vagina normal.  Neurological: She is alert.  Psychiatric: She has a normal mood and affect.    Prenatal labs: ABO, Rh: A/Positive/-- (10/16 0000) Antibody: Negative (10/16 0000) Rubella: Immune (10/16 0000) RPR: Nonreactive (10/16 0000)  HBsAg: Negative (10/16 0000)  HIV: Non-reactive (10/16 0000)  GBS: Negative (04/20 0000)  Hgb AA First trimester screen and AFP negative Cystic fibroisis negative in past pregnancy One hour GCT 128  Assessment/Plan: Pt with IOL at term with prenatal course complicated by [facility] and normal growth, reassuring fetal surveillance.  Plan IOL with pitocin and AROM when able.     Kathryn Terry 04/28/2017, 10:19 PM

## 2017-04-29 ENCOUNTER — Inpatient Hospital Stay (HOSPITAL_COMMUNITY)
Admission: RE | Admit: 2017-04-29 | Discharge: 2017-04-30 | DRG: 775 | Disposition: A | Discharge status: Home / Self Care | Payer: BC Managed Care – PPO | Source: Ambulatory Visit | Attending: Obstetrics and Gynecology | Admitting: Obstetrics and Gynecology

## 2017-04-29 ENCOUNTER — Encounter (HOSPITAL_COMMUNITY): Payer: Self-pay

## 2017-04-29 DIAGNOSIS — D259 Leiomyoma of uterus, unspecified: Secondary | ICD-10-CM | POA: Diagnosis present

## 2017-04-29 DIAGNOSIS — Z3493 Encounter for supervision of normal pregnancy, unspecified, third trimester: Secondary | ICD-10-CM | POA: Diagnosis present

## 2017-04-29 DIAGNOSIS — Z3A39 39 weeks gestation of pregnancy: Secondary | ICD-10-CM

## 2017-04-29 DIAGNOSIS — O3413 Maternal care for benign tumor of corpus uteri, third trimester: Secondary | ICD-10-CM | POA: Diagnosis present

## 2017-04-29 LAB — CBC
HCT: 35.2 % — ABNORMAL LOW (ref 36.0–46.0)
Hemoglobin: 11.8 g/dL — ABNORMAL LOW (ref 12.0–15.0)
MCH: 30 pg (ref 26.0–34.0)
MCHC: 33.5 g/dL (ref 30.0–36.0)
MCV: 89.6 fL (ref 78.0–100.0)
Platelets: 163 10*3/uL (ref 150–400)
RBC: 3.93 MIL/uL (ref 3.87–5.11)
RDW: 13.9 % (ref 11.5–15.5)
WBC: 7.5 10*3/uL (ref 4.0–10.5)

## 2017-04-29 LAB — ABO/RH: ABO/RH(D): A POS

## 2017-04-29 LAB — TYPE AND SCREEN
ABO/RH(D): A POS
Antibody Screen: NEGATIVE

## 2017-04-29 LAB — RPR: RPR Ser Ql: NONREACTIVE

## 2017-04-29 MED ORDER — TETANUS-DIPHTH-ACELL PERTUSSIS 5-2.5-18.5 LF-MCG/0.5 IM SUSP: 0.5000 mL | Freq: Once | INTRAMUSCULAR | Status: DC

## 2017-04-29 MED ORDER — SENNOSIDES-DOCUSATE SODIUM 8.6-50 MG PO TABS
2.0000 | ORAL_TABLET | ORAL | Status: DC
  Administered 2017-04-29: 2 via ORAL
  Filled 2017-04-29: qty 2

## 2017-04-29 MED ORDER — COCONUT OIL OIL: 1.0000 "application " | TOPICAL_OIL | Status: DC | PRN

## 2017-04-29 MED ORDER — LACTATED RINGERS IV SOLN
INTRAVENOUS | Status: DC
  Administered 2017-04-29: 125 mL/h via INTRAVENOUS

## 2017-04-29 MED ORDER — TERBUTALINE SULFATE 1 MG/ML IJ SOLN
0.2500 mg | Freq: Once | INTRAMUSCULAR | Status: DC | PRN
  Filled 2017-04-29: qty 1

## 2017-04-29 MED ORDER — ACETAMINOPHEN 325 MG PO TABS: 650.0000 mg | ORAL_TABLET | ORAL | Status: DC | PRN

## 2017-04-29 MED ORDER — DIBUCAINE 1 % RE OINT: 1.0000 "application " | TOPICAL_OINTMENT | RECTAL | Status: DC | PRN

## 2017-04-29 MED ORDER — OXYTOCIN BOLUS FROM INFUSION
500.0000 mL | Freq: Once | INTRAVENOUS | Status: AC
Start: 2017-04-29 — End: 2017-04-29
  Administered 2017-04-29: 500 mL via INTRAVENOUS

## 2017-04-29 MED ORDER — DIPHENHYDRAMINE HCL 25 MG PO CAPS: 25.0000 mg | ORAL_CAPSULE | Freq: Four times a day (QID) | ORAL | Status: DC | PRN

## 2017-04-29 MED ORDER — OXYTOCIN 40 UNITS IN LACTATED RINGERS INFUSION - SIMPLE MED
2.5000 [IU]/h | INTRAVENOUS | Status: DC
  Administered 2017-04-29: 2.5 [IU]/h via INTRAVENOUS
  Filled 2017-04-29: qty 1000

## 2017-04-29 MED ORDER — OXYTOCIN 40 UNITS IN LACTATED RINGERS INFUSION - SIMPLE MED
1.0000 m[IU]/min | INTRAVENOUS | Status: DC
  Administered 2017-04-29: 2 m[IU]/min via INTRAVENOUS

## 2017-04-29 MED ORDER — LACTATED RINGERS IV SOLN: 500.0000 mL | INTRAVENOUS | Status: DC | PRN

## 2017-04-29 MED ORDER — WITCH HAZEL-GLYCERIN EX PADS: 1.0000 "application " | MEDICATED_PAD | CUTANEOUS | Status: DC | PRN

## 2017-04-29 MED ORDER — ONDANSETRON HCL 4 MG/2ML IJ SOLN: 4.0000 mg | INTRAMUSCULAR | Status: DC | PRN

## 2017-04-29 MED ORDER — ONDANSETRON HCL 4 MG/2ML IJ SOLN: 4.0000 mg | Freq: Four times a day (QID) | INTRAMUSCULAR | Status: DC | PRN

## 2017-04-29 MED ORDER — ZOLPIDEM TARTRATE 5 MG PO TABS: 5.0000 mg | ORAL_TABLET | Freq: Every evening | ORAL | Status: DC | PRN

## 2017-04-29 MED ORDER — BENZOCAINE-MENTHOL 20-0.5 % EX AERO
1.0000 "application " | INHALATION_SPRAY | CUTANEOUS | Status: DC | PRN
  Administered 2017-04-29: 1 via TOPICAL
  Filled 2017-04-29: qty 56

## 2017-04-29 MED ORDER — OXYCODONE-ACETAMINOPHEN 5-325 MG PO TABS
1.0000 | ORAL_TABLET | ORAL | Status: DC | PRN
Start: 2017-04-29 — End: 2017-04-29

## 2017-04-29 MED ORDER — OXYCODONE HCL 5 MG PO TABS: 10.0000 mg | ORAL_TABLET | ORAL | Status: DC | PRN

## 2017-04-29 MED ORDER — SIMETHICONE 80 MG PO CHEW: 80.0000 mg | CHEWABLE_TABLET | ORAL | Status: DC | PRN

## 2017-04-29 MED ORDER — PRENATAL MULTIVITAMIN CH
1.0000 | ORAL_TABLET | Freq: Every day | ORAL | Status: DC
  Administered 2017-04-30: 1 via ORAL
  Filled 2017-04-29: qty 1

## 2017-04-29 MED ORDER — OXYCODONE-ACETAMINOPHEN 5-325 MG PO TABS: 2.0000 | ORAL_TABLET | ORAL | Status: DC | PRN

## 2017-04-29 MED ORDER — IBUPROFEN 600 MG PO TABS
600.0000 mg | ORAL_TABLET | Freq: Four times a day (QID) | ORAL | Status: DC
  Administered 2017-04-29 – 2017-04-30 (×4): 600 mg via ORAL
  Filled 2017-04-29 (×4): qty 1

## 2017-04-29 MED ORDER — LIDOCAINE HCL (PF) 1 % IJ SOLN
30.0000 mL | INTRAMUSCULAR | Status: AC | PRN
  Administered 2017-04-29 (×2): 30 mL via SUBCUTANEOUS
  Filled 2017-04-29 (×2): qty 30

## 2017-04-29 MED ORDER — OXYCODONE HCL 5 MG PO TABS
5.0000 mg | ORAL_TABLET | ORAL | Status: DC | PRN
  Administered 2017-04-29: 5 mg via ORAL
  Filled 2017-04-29: qty 1

## 2017-04-29 MED ORDER — ONDANSETRON HCL 4 MG PO TABS: 4.0000 mg | ORAL_TABLET | ORAL | Status: DC | PRN

## 2017-04-29 MED ORDER — SOD CITRATE-CITRIC ACID 500-334 MG/5ML PO SOLN: 30.0000 mL | ORAL | Status: DC | PRN

## 2017-04-29 MED ORDER — DOCUSATE SODIUM 100 MG PO CAPS
100.0000 mg | ORAL_CAPSULE | Freq: Two times a day (BID) | ORAL | Status: DC
  Filled 2017-04-29: qty 1

## 2017-04-29 MED ORDER — BUTORPHANOL TARTRATE 1 MG/ML IJ SOLN: 1.0000 mg | INTRAMUSCULAR | Status: DC | PRN

## 2017-04-29 NOTE — Progress Notes (Signed)
Patient ID: Kathryn Terry, female   DOB: [birth year], 36 y.o.   MRN: [id number] Pt admitted and on pitocin, feeling mild/moderate contractions  FHR Category 1  Cervix 50/2-3/-2  AROM clear  Plans to have medicine free birth, may try nitrous

## 2017-04-29 NOTE — Lactation Note (Signed)
This note was copied from a baby's chart. Lactation Consultation Note P2 mom with BF exp. 13 months with previous child, now 85 yr. old.  Infant at breast when [facility] entered room.  Rhythmic sucking noted.  LC encouraged mom to massage and use breast compression during feed.  Mom and dad very pleasant; when asked about hand expression she states she knows how to hand expression and was able to demonstrate with gtts of colostrum seen on nipple.  Mom hand expressed and spoon fed previous child in the hospital after delivery.  BF basics reviewed with mom and dad.  Brochure with OP services, BFSG, and lactation number given to family.  Kathryn Terry instructed family to feed on demand with cues at least 8-12 times in a 24 hour period.  Encouraged family to call out with questions or concerns regarding bf.  Mom denied further questions at this time.    Patient Name: Kathryn Terry RAXEN'M Date: 04/29/2017 Reason for consult: Initial assessment   Maternal Data Formula Feeding for Exclusion: No Has patient been taught Hand Expression?: Yes Does the patient have breastfeeding experience prior to this delivery?: Yes  Feeding Feeding Type: Breast Fed Length of feed:  (latched when LC entered room)  LATCH Score/Interventions Latch: Repeated attempts needed to sustain latch, nipple held in mouth throughout feeding, stimulation needed to elicit sucking reflex.  Audible Swallowing: A few with stimulation Intervention(s): Hand expression  Type of Nipple: Everted at rest and after stimulation  Comfort (Breast/Nipple): Soft / non-tender     Hold (Positioning): Assistance needed to correctly position infant at breast and maintain latch. Intervention(s): Breastfeeding basics reviewed;Support Pillows;Skin to skin  LATCH Score: 7  Lactation Tools Discussed/Used     Consult Status Consult Status: Follow-up Date: 04/30/17 Follow-up type: In-patient    Ferne Coe [facility] 04/29/2017, 2:42 PM

## 2017-04-29 NOTE — Anesthesia Pain Management Evaluation Note (Signed)
  CRNA Pain Management Visit Note  Patient: Kathryn Terry, 36 y.o., female  "Hello I am a member of the anesthesia team at [facility]. We have an anesthesia team available at all times to provide care throughout the hospital, including epidural management and anesthesia for C-section. I don't know your plan for the delivery whether it a natural birth, water birth, IV sedation, nitrous supplementation, doula or epidural, but we want to meet your pain goals."   1.Was your pain managed to your expectations on prior hospitalizations?   Yes   2.What is your expectation for pain management during this hospitalization?     Labor support without medications  3.How can we help you reach that goal? Be available if needed  Record the patient's initial score and the patient's pain goal.   Pain: 4  Pain Goal: 10 [facility] wants you to be able to say your pain was always managed very well.  Yassin Scales 04/29/2017

## 2017-04-30 LAB — CBC
HCT: 27 % — ABNORMAL LOW (ref 36.0–46.0)
Hemoglobin: 9.4 g/dL — ABNORMAL LOW (ref 12.0–15.0)
MCH: 30.9 pg (ref 26.0–34.0)
MCHC: 34.8 g/dL (ref 30.0–36.0)
MCV: 88.8 fL (ref 78.0–100.0)
Platelets: 139 10*3/uL — ABNORMAL LOW (ref 150–400)
RBC: 3.04 MIL/uL — ABNORMAL LOW (ref 3.87–5.11)
RDW: 13.9 % (ref 11.5–15.5)
WBC: 13.6 10*3/uL — ABNORMAL HIGH (ref 4.0–10.5)

## 2017-04-30 MED ORDER — IBUPROFEN 600 MG PO TABS: 600.0000 mg | ORAL_TABLET | Freq: Four times a day (QID) | ORAL | 0 refills | Status: DC

## 2017-04-30 NOTE — Lactation Note (Signed)
This note was copied from a baby's chart. Lactation Consultation Note  Patient Name: Kathryn Terry Date: 04/30/2017  Mom states she is feeling good about feedings.  She breastfed her previous 2 babies so feels confident.  Outpatient lactation services reviewed and encouraged prn.   Maternal Data    Feeding Feeding Type: Breast Fed Length of feed: 25 min  LATCH Score/Interventions                      Lactation Tools Discussed/Used     Consult Status      Kathryn Terry 04/30/2017, 2:22 PM

## 2017-04-30 NOTE — Discharge Instructions (Signed)
As per discharge pamphlet °

## 2017-04-30 NOTE — Progress Notes (Signed)
Patient ID: Kathryn Terry, female   DOB: 1980/12/29, 36 y.o.   MRN: [id number] PPD #1 No problems Afeb, VSS Fundus firm, NT at U+1 Continue routine postpartum care, will check on circumcision, she may want to go home later today if baby can go

## 2017-05-01 ENCOUNTER — Encounter (HOSPITAL_COMMUNITY): Payer: Self-pay | Admitting: *Deleted

## 2017-05-01 NOTE — Discharge Summary (Signed)
    OB Discharge Summary     Patient Name: Kathryn Terry DOB: 07-13-1981 MRN: [id number]  Date of admission: 04/29/2017 Delivering MD: Paula Compton   Date of discharge: 05/01/2017  Admitting diagnosis: induction Intrauterine pregnancy: [redacted]w[redacted]d     Secondary diagnosis:  Active Problems:   Indication for care in labor and delivery, antepartum   Shoulder dystocia during labor and delivery, delivered      Discharge diagnosis: Term Pregnancy Delivered and shoulder dystocia                          Hospital course:  Induction of Labor With Vaginal Delivery   36 y.o. yo [id number] at [redacted]w[redacted]d was admitted to the hospital 04/29/2017 for induction of labor.  Indication for induction: Favorable cervix at term.  Patient had an uncomplicated labor course as follows: Membrane Rupture Time/Date: 9:12 AM ,04/29/2017   Intrapartum Procedures: Episiotomy: None [1]                                         Lacerations:  3rd degree [4]  Patient had delivery of a Viable infant.  Information for the patient's newborn:  Kathryn Terry [[id number]]  Delivery Method: Vaginal, Spontaneous Delivery (Filed from Delivery Summary)   04/29/2017  Details of delivery can be found in separate delivery note, 3 minute shoulder dystocia.  Patient had a routine postpartum course. Patient is discharged home at her request on PPD #1, 04/30/2017.  Physical exam  Vitals:   04/29/17 1610 04/29/17 1630 04/29/17 2020 04/30/17 0505  BP: 122/64 132/63 (!) 109/57 107/61  Pulse: (!) 106 97 91 89  Resp: 18 18 18 18   Temp: 98.9 F (37.2 C) 98.1 F (36.7 C) 98.7 F (37.1 C) 98 F (36.7 C)  TempSrc: Oral Oral Oral Oral  SpO2:    100%  Weight:      Height:       General: alert Lochia: appropriate Uterine Fundus: firm  Labs: Lab Results  Component Value Date   WBC 13.6 (H) 04/30/2017   HGB 9.4 (L) 04/30/2017   HCT 27.0 (L) 04/30/2017   MCV 88.8 04/30/2017   PLT 139 (L) 04/30/2017   CMP Latest Ref Rng & Units  08/18/2010  Glucose 70 - 99 mg/dL 92  BUN 6 - 23 mg/dL 10  Creatinine 0.4 - 1.2 mg/dL 0.7  Sodium 135 - 145 mEq/L 139  Potassium 3.5 - 5.1 mEq/L 3.6  Chloride 96 - 112 mEq/L 106    Discharge instruction: per After Visit Summary and "Baby and Me Booklet".  After visit meds:  Allergies as of 04/30/2017   No Known Allergies     Medication List    TAKE these medications   ibuprofen 600 MG tablet Commonly known as:  ADVIL,MOTRIN Take 1 tablet (600 mg total) by mouth every 6 (six) hours.   prenatal multivitamin Tabs tablet Take 1 tablet by mouth daily at 12 noon.       Diet: routine diet  Activity: Advance as tolerated. Pelvic rest for 6 weeks.   Outpatient follow up:3 weeks   Newborn Data: Live born female  Birth Weight: 8 lb 8.3 oz (3865 g) APGAR: 4, 8  Baby Feeding: Breast Disposition:home with mother   05/01/2017 Clarene Duke, MD

## 2017-05-04 ENCOUNTER — Inpatient Hospital Stay (HOSPITAL_COMMUNITY): Admission: AD | Admit: 2017-05-04 | Payer: Self-pay | Source: Ambulatory Visit | Admitting: Obstetrics and Gynecology

## 2018-01-22 ENCOUNTER — Encounter: Payer: Self-pay | Admitting: General Practice

## 2018-01-23 ENCOUNTER — Encounter: Payer: Self-pay | Admitting: Family Medicine

## 2018-01-23 ENCOUNTER — Ambulatory Visit (INDEPENDENT_AMBULATORY_CARE_PROVIDER_SITE_OTHER): Payer: BC Managed Care – PPO | Admitting: Family Medicine

## 2018-01-23 ENCOUNTER — Other Ambulatory Visit: Payer: Self-pay

## 2018-01-23 VITALS — BP 120/78 | HR 82 | Temp 98.1°F | Resp 17 | Ht 62.0 in | Wt 126.2 lb

## 2018-01-23 DIAGNOSIS — Z0000 Encounter for general adult medical examination without abnormal findings: Secondary | ICD-10-CM | POA: Diagnosis not present

## 2018-01-23 DIAGNOSIS — Z833 Family history of diabetes mellitus: Secondary | ICD-10-CM

## 2018-01-23 LAB — CBC WITH DIFFERENTIAL/PLATELET
Basophils Absolute: 0 10*3/uL (ref 0.0–0.1)
Basophils Relative: 0.5 % (ref 0.0–3.0)
Eosinophils Absolute: 0.1 10*3/uL (ref 0.0–0.7)
Eosinophils Relative: 2.4 % (ref 0.0–5.0)
HCT: 40.1 % (ref 36.0–46.0)
Hemoglobin: 13.4 g/dL (ref 12.0–15.0)
Lymphocytes Relative: 43.2 % (ref 12.0–46.0)
Lymphs Abs: 2.1 10*3/uL (ref 0.7–4.0)
MCHC: 33.4 g/dL (ref 30.0–36.0)
MCV: 92.8 fl (ref 78.0–100.0)
Monocytes Absolute: 0.2 10*3/uL (ref 0.1–1.0)
Monocytes Relative: 5 % (ref 3.0–12.0)
Neutro Abs: 2.4 10*3/uL (ref 1.4–7.7)
Neutrophils Relative %: 48.9 % (ref 43.0–77.0)
Platelets: 238 10*3/uL (ref 150.0–400.0)
RBC: 4.32 Mil/uL (ref 3.87–5.11)
RDW: 13 % (ref 11.5–15.5)
WBC: 4.8 10*3/uL (ref 4.0–10.5)

## 2018-01-23 LAB — HEPATIC FUNCTION PANEL
ALT: 8 U/L (ref 0–35)
AST: 10 U/L (ref 0–37)
Albumin: 4.8 g/dL (ref 3.5–5.2)
Alkaline Phosphatase: 84 U/L (ref 39–117)
Bilirubin, Direct: 0.1 mg/dL (ref 0.0–0.3)
Total Bilirubin: 0.4 mg/dL (ref 0.2–1.2)
Total Protein: 7.9 g/dL (ref 6.0–8.3)

## 2018-01-23 LAB — BASIC METABOLIC PANEL
BUN: 11 mg/dL (ref 6–23)
CO2: 26 mEq/L (ref 19–32)
Calcium: 9.3 mg/dL (ref 8.4–10.5)
Chloride: 105 mEq/L (ref 96–112)
Creatinine, Ser: 0.64 mg/dL (ref 0.40–1.20)
GFR: 136.2 mL/min (ref >60.00)
Glucose, Bld: 75 mg/dL (ref 70–99)
Potassium: 3.8 mEq/L (ref 3.5–5.1)
Sodium: 138 mEq/L (ref 135–145)

## 2018-01-23 LAB — LIPID PANEL
Cholesterol: 155 mg/dL (ref 0–200)
HDL: 69.7 mg/dL (ref >39.00)
LDL Cholesterol: 78 mg/dL (ref 0–99)
NonHDL: 85.38
Total CHOL/HDL Ratio: 2
Triglycerides: 35 mg/dL (ref 0.0–149.0)
VLDL: 7 mg/dL (ref 0.0–40.0)

## 2018-01-23 LAB — TSH: TSH: 1.65 u[IU]/mL (ref 0.35–4.50)

## 2018-01-23 NOTE — Assessment & Plan Note (Signed)
Pt's PE WNL.  UTD on GYN.  Check labs.  Anticipatory guidance provided.  

## 2018-01-23 NOTE — Progress Notes (Signed)
   Subjective:    Patient ID: Kathryn Terry, female    DOB: [birth year], 37 y.o.   MRN: [id number]  HPI New to establish.  Previous MD- none.  CPE- UTD on GYN, Tdap.   Review of Systems Patient reports no vision/ hearing changes, adenopathy,fever, weight change,  persistant/recurrent hoarseness , swallowing issues, chest pain, palpitations, edema, persistant/recurrent cough, hemoptysis, dyspnea (rest/exertional/paroxysmal nocturnal), gastrointestinal bleeding (melena, rectal bleeding), abdominal pain, significant heartburn, bowel changes, GU symptoms (dysuria, hematuria, incontinence), Gyn symptoms (abnormal  bleeding, pain),  syncope, focal weakness, memory loss, numbness & tingling, skin/hair/nail changes, abnormal bruising or bleeding, anxiety, or depression.     Objective:   Physical Exam General Appearance:    Alert, cooperative, no distress, appears stated age  Head:    Normocephalic, without obvious abnormality, atraumatic  Eyes:    PERRL, conjunctiva/corneas clear, EOM's intact, fundi    benign, both eyes  Ears:    Normal TM's and external ear canals, both ears  Nose:   Nares normal, septum midline, mucosa normal, no drainage    or sinus tenderness  Throat:   Lips, mucosa, and tongue normal; teeth and gums normal  Neck:   Supple, symmetrical, trachea midline, no adenopathy;    Thyroid: no enlargement/tenderness/nodules  Back:     Symmetric, no curvature, ROM normal, no CVA tenderness  Lungs:     Clear to auscultation bilaterally, respirations unlabored  Chest Wall:    No tenderness or deformity   Heart:    Regular rate and rhythm, S1 and S2 normal, no murmur, rub   or gallop  Breast Exam:    Deferred to GYN  Abdomen:     Soft, non-tender, bowel sounds active all four quadrants,    no masses, no organomegaly  Genitalia:    Deferred to GYN  Rectal:    Extremities:   Extremities normal, atraumatic, no cyanosis or edema  Pulses:   2+ and symmetric all extremities  Skin:   Skin  color, texture, turgor normal, no rashes or lesions  Lymph nodes:   Cervical, supraclavicular, and axillary nodes normal  Neurologic:   CNII-XII intact, normal strength, sensation and reflexes    throughout          Assessment & Plan:  Family hx of DM- pt's maternal grandmother and mother both have diabetes.  Will check labs to assess glucose, lipids, thyroid.

## 2018-01-23 NOTE — Patient Instructions (Signed)
Follow up in 1 year or as needed We'll notify you of your lab results and make any changes if needed Keep up the good work on healthy diet and regular exercise- you look great! Call with any questions or concerns Welcome!  We're glad to have you!!! 

## 2018-01-24 ENCOUNTER — Encounter: Payer: Self-pay | Admitting: General Practice

## 2020-02-08 ENCOUNTER — Ambulatory Visit: Payer: BC Managed Care – PPO | Attending: Family

## 2020-02-08 DIAGNOSIS — Z23 Encounter for immunization: Secondary | ICD-10-CM | POA: Insufficient documentation

## 2020-02-08 NOTE — Progress Notes (Signed)
   Covid-19 Vaccination Clinic  Name:  Kathryn Terry    MRN: [id number] DOB: May 05, 1981  02/08/2020  Kathryn Terry was observed post Covid-19 immunization for 15 minutes without incidence. She was provided with Vaccine Information Sheet and instruction to access the V-Safe system.   Kathryn Terry was instructed to call 911 with any severe reactions post vaccine: Marland Kitchen Difficulty breathing  . Swelling of your face and throat  . A fast heartbeat  . A bad rash all over your body  . Dizziness and weakness    Immunizations Administered    Name Date Dose VIS Date Route   Moderna COVID-19 Vaccine 02/08/2020  2:25 PM 0.5 mL 11/17/2019 Intramuscular   Manufacturer: Moderna   Lot: [id number]   [id number]

## 2020-03-15 ENCOUNTER — Ambulatory Visit: Payer: BC Managed Care – PPO | Attending: Family

## 2020-03-15 DIAGNOSIS — Z23 Encounter for immunization: Secondary | ICD-10-CM

## 2020-03-15 NOTE — Progress Notes (Signed)
   Covid-19 Vaccination Clinic  Name:  Kathryn Terry    MRN: [id number] DOB: [birth year]  03/15/2020  [name] was observed post Covid-19 immunization for 15 minutes without incident. She was provided with Vaccine Information Sheet and instruction to access the V-Safe system.   [name] was instructed to call 911 with any severe reactions post vaccine: Marland Kitchen Difficulty breathing  . Swelling of face and throat  . A fast heartbeat  . A bad rash all over body  . Dizziness and weakness   Immunizations Administered    Name Date Dose VIS Date Route   Moderna COVID-19 Vaccine 03/15/2020  4:16 PM 0.5 mL 11/17/2019 Intramuscular   Manufacturer: Moderna   Lot: [id number]   [place]:[id number]

## 2020-10-19 ENCOUNTER — Encounter: Payer: BC Managed Care – PPO | Attending: Obstetrics and Gynecology | Admitting: Registered"

## 2020-10-19 DIAGNOSIS — O24419 Gestational diabetes mellitus in pregnancy, unspecified control: Secondary | ICD-10-CM | POA: Insufficient documentation

## 2020-11-01 ENCOUNTER — Other Ambulatory Visit: Payer: Self-pay | Admitting: Obstetrics and Gynecology

## 2020-11-01 DIAGNOSIS — N134 Hydroureter: Secondary | ICD-10-CM

## 2020-11-02 ENCOUNTER — Encounter: Payer: Self-pay | Admitting: Registered"

## 2020-11-02 ENCOUNTER — Other Ambulatory Visit: Payer: Self-pay

## 2020-11-02 ENCOUNTER — Encounter: Payer: BC Managed Care – PPO | Admitting: Registered"

## 2020-11-02 DIAGNOSIS — O24419 Gestational diabetes mellitus in pregnancy, unspecified control: Secondary | ICD-10-CM

## 2020-11-02 NOTE — Progress Notes (Signed)
Patient was seen on 11/02/20 for Gestational Diabetes self-management class at [facility]. Pt states she has received diabetes education from obgyn and also states she has started using insulin.  The following learning objectives were met by the patient during this course:   States the definition of Gestational Diabetes  States why dietary management is important in controlling blood glucose  Describes the effects each nutrient has on blood glucose levels  Demonstrates ability to create a balanced meal plan  Demonstrates carbohydrate counting   States when to check blood glucose levels  Demonstrates proper blood glucose monitoring techniques  States the effect of stress and exercise on blood glucose levels  States the importance of limiting caffeine and abstaining from alcohol and smoking  Blood glucose monitor given: none, patient has meter and checking prior to class.  Patient instructed to monitor glucose levels: FBS: 60 - <95; 1 hour: <140; 2 hour: <120  Patient received handouts:  Nutrition Diabetes and Pregnancy, including carb counting list  Patient will be seen for follow-up as needed.

## 2020-11-16 ENCOUNTER — Other Ambulatory Visit: Payer: Self-pay

## 2020-11-16 ENCOUNTER — Encounter: Payer: Self-pay | Admitting: *Deleted

## 2020-11-18 ENCOUNTER — Encounter: Payer: Self-pay | Admitting: *Deleted

## 2020-11-18 ENCOUNTER — Ambulatory Visit: Payer: BC Managed Care – PPO | Admitting: *Deleted

## 2020-11-18 ENCOUNTER — Ambulatory Visit: Payer: BC Managed Care – PPO | Attending: Obstetrics and Gynecology

## 2020-11-18 ENCOUNTER — Other Ambulatory Visit: Payer: Self-pay

## 2020-11-18 VITALS — BP 104/64 | HR 105 | Ht 61.0 in

## 2020-11-18 DIAGNOSIS — O09293 Supervision of pregnancy with other poor reproductive or obstetric history, third trimester: Secondary | ICD-10-CM | POA: Diagnosis not present

## 2020-11-18 DIAGNOSIS — Z3A35 35 weeks gestation of pregnancy: Secondary | ICD-10-CM

## 2020-11-18 DIAGNOSIS — Z363 Encounter for antenatal screening for malformations: Secondary | ICD-10-CM

## 2020-11-18 DIAGNOSIS — O3413 Maternal care for benign tumor of corpus uteri, third trimester: Secondary | ICD-10-CM | POA: Diagnosis not present

## 2020-11-18 DIAGNOSIS — D259 Leiomyoma of uterus, unspecified: Secondary | ICD-10-CM

## 2020-11-18 DIAGNOSIS — O10913 Unspecified pre-existing hypertension complicating pregnancy, third trimester: Secondary | ICD-10-CM

## 2020-11-18 DIAGNOSIS — O09523 Supervision of elderly multigravida, third trimester: Secondary | ICD-10-CM | POA: Insufficient documentation

## 2020-11-18 DIAGNOSIS — N134 Hydroureter: Secondary | ICD-10-CM | POA: Diagnosis not present

## 2020-11-18 DIAGNOSIS — O24414 Gestational diabetes mellitus in pregnancy, insulin controlled: Secondary | ICD-10-CM | POA: Diagnosis not present

## 2020-11-21 ENCOUNTER — Other Ambulatory Visit: Payer: Self-pay | Admitting: *Deleted

## 2020-11-21 DIAGNOSIS — O24419 Gestational diabetes mellitus in pregnancy, unspecified control: Secondary | ICD-10-CM

## 2020-11-29 ENCOUNTER — Ambulatory Visit: Payer: BC Managed Care – PPO

## 2020-12-02 ENCOUNTER — Telehealth (HOSPITAL_COMMUNITY): Payer: Self-pay | Admitting: *Deleted

## 2020-12-02 ENCOUNTER — Encounter (HOSPITAL_COMMUNITY): Payer: Self-pay | Admitting: *Deleted

## 2020-12-02 NOTE — Telephone Encounter (Signed)
Preadmission screen  

## 2020-12-08 ENCOUNTER — Ambulatory Visit: Payer: BC Managed Care – PPO | Attending: Obstetrics and Gynecology

## 2020-12-08 ENCOUNTER — Other Ambulatory Visit: Payer: Self-pay

## 2020-12-08 ENCOUNTER — Ambulatory Visit: Payer: BC Managed Care – PPO | Admitting: *Deleted

## 2020-12-08 ENCOUNTER — Encounter: Payer: Self-pay | Admitting: *Deleted

## 2020-12-08 VITALS — BP 104/68 | HR 91

## 2020-12-08 DIAGNOSIS — O24419 Gestational diabetes mellitus in pregnancy, unspecified control: Secondary | ICD-10-CM | POA: Diagnosis present

## 2020-12-08 DIAGNOSIS — Z3A38 38 weeks gestation of pregnancy: Secondary | ICD-10-CM

## 2020-12-08 DIAGNOSIS — O10913 Unspecified pre-existing hypertension complicating pregnancy, third trimester: Secondary | ICD-10-CM | POA: Diagnosis not present

## 2020-12-08 DIAGNOSIS — D259 Leiomyoma of uterus, unspecified: Secondary | ICD-10-CM

## 2020-12-08 DIAGNOSIS — O09523 Supervision of elderly multigravida, third trimester: Secondary | ICD-10-CM

## 2020-12-08 DIAGNOSIS — O09293 Supervision of pregnancy with other poor reproductive or obstetric history, third trimester: Secondary | ICD-10-CM

## 2020-12-08 DIAGNOSIS — O24414 Gestational diabetes mellitus in pregnancy, insulin controlled: Secondary | ICD-10-CM | POA: Insufficient documentation

## 2020-12-08 DIAGNOSIS — O3413 Maternal care for benign tumor of corpus uteri, third trimester: Secondary | ICD-10-CM | POA: Diagnosis not present

## 2020-12-14 ENCOUNTER — Other Ambulatory Visit (HOSPITAL_COMMUNITY)
Admission: RE | Admit: 2020-12-14 | Discharge: 2020-12-14 | Disposition: A | Discharge status: Home / Self Care | Payer: BC Managed Care – PPO | Source: Ambulatory Visit | Attending: Obstetrics and Gynecology | Admitting: Obstetrics and Gynecology

## 2020-12-14 DIAGNOSIS — Z20822 Contact with and (suspected) exposure to covid-19: Secondary | ICD-10-CM | POA: Insufficient documentation

## 2020-12-14 DIAGNOSIS — Z01812 Encounter for preprocedural laboratory examination: Secondary | ICD-10-CM | POA: Insufficient documentation

## 2020-12-14 LAB — SARS CORONAVIRUS 2 (TAT 6-24 HRS): SARS Coronavirus 2: NEGATIVE

## 2020-12-16 ENCOUNTER — Other Ambulatory Visit: Payer: Self-pay

## 2020-12-16 ENCOUNTER — Inpatient Hospital Stay (HOSPITAL_COMMUNITY): Payer: BC Managed Care – PPO

## 2020-12-16 ENCOUNTER — Encounter (HOSPITAL_COMMUNITY): Payer: Self-pay | Admitting: Obstetrics and Gynecology

## 2020-12-16 ENCOUNTER — Inpatient Hospital Stay (HOSPITAL_COMMUNITY): Payer: BC Managed Care – PPO | Admitting: Anesthesiology

## 2020-12-16 ENCOUNTER — Encounter (HOSPITAL_COMMUNITY)
Admission: AD | Disposition: A | Discharge status: Home / Self Care | Payer: Self-pay | Source: Home / Self Care | Attending: Obstetrics and Gynecology

## 2020-12-16 ENCOUNTER — Inpatient Hospital Stay (HOSPITAL_COMMUNITY)
Admission: AD | Admit: 2020-12-16 | Discharge: 2020-12-19 | DRG: 787 | Disposition: A | Discharge status: Home / Self Care | Payer: BC Managed Care – PPO | Attending: Obstetrics and Gynecology | Admitting: Obstetrics and Gynecology

## 2020-12-16 ENCOUNTER — Other Ambulatory Visit: Payer: Self-pay | Admitting: Obstetrics and Gynecology

## 2020-12-16 DIAGNOSIS — O9081 Anemia of the puerperium: Secondary | ICD-10-CM | POA: Diagnosis not present

## 2020-12-16 DIAGNOSIS — D259 Leiomyoma of uterus, unspecified: Secondary | ICD-10-CM | POA: Diagnosis present

## 2020-12-16 DIAGNOSIS — O358XX0 Maternal care for other (suspected) fetal abnormality and damage, not applicable or unspecified: Secondary | ICD-10-CM | POA: Diagnosis present

## 2020-12-16 DIAGNOSIS — D62 Acute posthemorrhagic anemia: Secondary | ICD-10-CM | POA: Diagnosis not present

## 2020-12-16 DIAGNOSIS — E669 Obesity, unspecified: Secondary | ICD-10-CM | POA: Diagnosis present

## 2020-12-16 DIAGNOSIS — Z3A39 39 weeks gestation of pregnancy: Secondary | ICD-10-CM | POA: Diagnosis not present

## 2020-12-16 DIAGNOSIS — O24424 Gestational diabetes mellitus in childbirth, insulin controlled: Secondary | ICD-10-CM | POA: Diagnosis present

## 2020-12-16 DIAGNOSIS — O99214 Obesity complicating childbirth: Secondary | ICD-10-CM | POA: Diagnosis present

## 2020-12-16 DIAGNOSIS — O24419 Gestational diabetes mellitus in pregnancy, unspecified control: Secondary | ICD-10-CM | POA: Diagnosis present

## 2020-12-16 DIAGNOSIS — O3413 Maternal care for benign tumor of corpus uteri, third trimester: Secondary | ICD-10-CM | POA: Diagnosis present

## 2020-12-16 DIAGNOSIS — Z20822 Contact with and (suspected) exposure to covid-19: Secondary | ICD-10-CM | POA: Diagnosis present

## 2020-12-16 DIAGNOSIS — Z98891 History of uterine scar from previous surgery: Secondary | ICD-10-CM

## 2020-12-16 LAB — CBC
HCT: 32.5 % — ABNORMAL LOW (ref 36.0–46.0)
Hemoglobin: 10.8 g/dL — ABNORMAL LOW (ref 12.0–15.0)
MCH: 29.3 pg (ref 26.0–34.0)
MCHC: 33.2 g/dL (ref 30.0–36.0)
MCV: 88.3 fL (ref 80.0–100.0)
Platelets: 181 10*3/uL (ref 150–400)
RBC: 3.68 MIL/uL — ABNORMAL LOW (ref 3.87–5.11)
RDW: 16.7 % — ABNORMAL HIGH (ref 11.5–15.5)
WBC: 8.4 10*3/uL (ref 4.0–10.5)
nRBC: 0 % (ref 0.0–0.2)

## 2020-12-16 LAB — RPR: RPR Ser Ql: NONREACTIVE

## 2020-12-16 LAB — GLUCOSE, CAPILLARY: Glucose-Capillary: 80 mg/dL (ref 70–99)

## 2020-12-16 LAB — TYPE AND SCREEN
ABO/RH(D): A POS
Antibody Screen: NEGATIVE

## 2020-12-16 SURGERY — Surgical Case
Anesthesia: Epidural

## 2020-12-16 MED ORDER — STERILE WATER FOR IRRIGATION IR SOLN
Status: DC | PRN
  Administered 2020-12-16: 1

## 2020-12-16 MED ORDER — ACETAMINOPHEN 325 MG PO TABS: 650.0000 mg | ORAL_TABLET | ORAL | Status: DC | PRN

## 2020-12-16 MED ORDER — IBUPROFEN 800 MG PO TABS
800.0000 mg | ORAL_TABLET | Freq: Three times a day (TID) | ORAL | Status: DC
  Administered 2020-12-17 – 2020-12-19 (×7): 800 mg via ORAL
  Filled 2020-12-16 (×7): qty 1

## 2020-12-16 MED ORDER — OXYTOCIN-SODIUM CHLORIDE 30-0.9 UT/500ML-% IV SOLN
INTRAVENOUS | Status: AC
  Filled 2020-12-16: qty 500

## 2020-12-16 MED ORDER — CHLOROPROCAINE HCL (PF) 3 % IJ SOLN
INTRAMUSCULAR | Status: DC | PRN
  Administered 2020-12-16: 20 mL

## 2020-12-16 MED ORDER — OXYTOCIN-SODIUM CHLORIDE 30-0.9 UT/500ML-% IV SOLN: 2.5000 [IU]/h | INTRAVENOUS | Status: DC

## 2020-12-16 MED ORDER — PHENYLEPHRINE 40 MCG/ML (10ML) SYRINGE FOR IV PUSH (FOR BLOOD PRESSURE SUPPORT)
PREFILLED_SYRINGE | INTRAVENOUS | Status: DC | PRN
  Administered 2020-12-16 (×3): 40 ug via INTRAVENOUS

## 2020-12-16 MED ORDER — ONDANSETRON HCL 4 MG/2ML IJ SOLN: 4.0000 mg | Freq: Four times a day (QID) | INTRAMUSCULAR | Status: DC | PRN

## 2020-12-16 MED ORDER — BUTORPHANOL TARTRATE 1 MG/ML IJ SOLN: 1.0000 mg | INTRAMUSCULAR | Status: DC | PRN

## 2020-12-16 MED ORDER — FENTANYL CITRATE (PF) 100 MCG/2ML IJ SOLN
25.0000 ug | INTRAMUSCULAR | Status: DC | PRN
  Administered 2020-12-16: 50 ug via INTRAVENOUS

## 2020-12-16 MED ORDER — PHENYLEPHRINE 40 MCG/ML (10ML) SYRINGE FOR IV PUSH (FOR BLOOD PRESSURE SUPPORT): 80.0000 ug | PREFILLED_SYRINGE | INTRAVENOUS | Status: DC | PRN

## 2020-12-16 MED ORDER — SODIUM CHLORIDE (PF) 0.9 % IJ SOLN
INTRAMUSCULAR | Status: DC | PRN
  Administered 2020-12-16: 11 mL/h via EPIDURAL

## 2020-12-16 MED ORDER — DIPHENHYDRAMINE HCL 25 MG PO CAPS: 25.0000 mg | ORAL_CAPSULE | Freq: Four times a day (QID) | ORAL | Status: DC | PRN

## 2020-12-16 MED ORDER — KETOROLAC TROMETHAMINE 30 MG/ML IJ SOLN
INTRAMUSCULAR | Status: AC
  Filled 2020-12-16: qty 1

## 2020-12-16 MED ORDER — NALBUPHINE HCL 10 MG/ML IJ SOLN
5.0000 mg | Freq: Once | INTRAMUSCULAR | Status: DC | PRN
Start: 2020-12-16 — End: 2020-12-19

## 2020-12-16 MED ORDER — MEPERIDINE HCL 25 MG/ML IJ SOLN: 6.2500 mg | INTRAMUSCULAR | Status: DC | PRN

## 2020-12-16 MED ORDER — OXYTOCIN-SODIUM CHLORIDE 30-0.9 UT/500ML-% IV SOLN
1.0000 m[IU]/min | INTRAVENOUS | Status: DC
  Administered 2020-12-16: 2 m[IU]/min via INTRAVENOUS
  Filled 2020-12-16: qty 500

## 2020-12-16 MED ORDER — NALOXONE HCL 4 MG/10ML IJ SOLN
1.0000 ug/kg/h | INTRAVENOUS | Status: DC | PRN
  Filled 2020-12-16: qty 5

## 2020-12-16 MED ORDER — SENNOSIDES-DOCUSATE SODIUM 8.6-50 MG PO TABS
2.0000 | ORAL_TABLET | Freq: Every day | ORAL | Status: DC
  Administered 2020-12-17 – 2020-12-19 (×3): 2 via ORAL
  Filled 2020-12-16 (×3): qty 2

## 2020-12-16 MED ORDER — SOD CITRATE-CITRIC ACID 500-334 MG/5ML PO SOLN
30.0000 mL | ORAL | Status: DC | PRN
  Filled 2020-12-16: qty 15

## 2020-12-16 MED ORDER — LACTATED RINGERS IV SOLN: 500.0000 mL | INTRAVENOUS | Status: DC | PRN

## 2020-12-16 MED ORDER — LACTATED RINGERS AMNIOINFUSION: INTRAVENOUS | Status: DC

## 2020-12-16 MED ORDER — PHENYLEPHRINE 40 MCG/ML (10ML) SYRINGE FOR IV PUSH (FOR BLOOD PRESSURE SUPPORT)
PREFILLED_SYRINGE | INTRAVENOUS | Status: AC
  Filled 2020-12-16: qty 20

## 2020-12-16 MED ORDER — OXYCODONE-ACETAMINOPHEN 5-325 MG PO TABS
2.0000 | ORAL_TABLET | ORAL | Status: DC | PRN
Start: 2020-12-16 — End: 2020-12-16

## 2020-12-16 MED ORDER — OXYCODONE HCL 5 MG PO TABS
5.0000 mg | ORAL_TABLET | ORAL | Status: DC | PRN
  Administered 2020-12-17: 5 mg via ORAL
  Filled 2020-12-16: qty 1

## 2020-12-16 MED ORDER — KETOROLAC TROMETHAMINE 30 MG/ML IJ SOLN: 30.0000 mg | Freq: Four times a day (QID) | INTRAMUSCULAR | Status: AC | PRN

## 2020-12-16 MED ORDER — SIMETHICONE 80 MG PO CHEW
80.0000 mg | CHEWABLE_TABLET | ORAL | Status: DC | PRN
  Administered 2020-12-18: 80 mg via ORAL
  Filled 2020-12-16: qty 1

## 2020-12-16 MED ORDER — SODIUM CHLORIDE 0.9% FLUSH: 3.0000 mL | INTRAVENOUS | Status: DC | PRN

## 2020-12-16 MED ORDER — LIDOCAINE-EPINEPHRINE (PF) 2 %-1:200000 IJ SOLN
INTRAMUSCULAR | Status: DC | PRN
  Administered 2020-12-16: 5 mL via EPIDURAL
  Administered 2020-12-16: 2 mL via EPIDURAL
  Administered 2020-12-16 (×2): 4 mL via EPIDURAL

## 2020-12-16 MED ORDER — NALBUPHINE HCL 10 MG/ML IJ SOLN: 5.0000 mg | INTRAMUSCULAR | Status: DC | PRN

## 2020-12-16 MED ORDER — DIPHENHYDRAMINE HCL 25 MG PO CAPS: 25.0000 mg | ORAL_CAPSULE | ORAL | Status: DC | PRN

## 2020-12-16 MED ORDER — SIMETHICONE 80 MG PO CHEW
80.0000 mg | CHEWABLE_TABLET | Freq: Three times a day (TID) | ORAL | Status: DC
  Administered 2020-12-17 – 2020-12-19 (×7): 80 mg via ORAL
  Filled 2020-12-16 (×7): qty 1

## 2020-12-16 MED ORDER — ALBUMIN HUMAN 5 % IV SOLN: INTRAVENOUS | Status: DC | PRN

## 2020-12-16 MED ORDER — FENTANYL CITRATE (PF) 100 MCG/2ML IJ SOLN
INTRAMUSCULAR | Status: AC
  Filled 2020-12-16: qty 2

## 2020-12-16 MED ORDER — SODIUM CHLORIDE 0.9 % IR SOLN
Status: DC | PRN
  Administered 2020-12-16: 1

## 2020-12-16 MED ORDER — COCONUT OIL OIL: 1.0000 "application " | TOPICAL_OIL | Status: DC | PRN

## 2020-12-16 MED ORDER — ZOLPIDEM TARTRATE 5 MG PO TABS: 5.0000 mg | ORAL_TABLET | Freq: Every evening | ORAL | Status: DC | PRN

## 2020-12-16 MED ORDER — DIPHENHYDRAMINE HCL 50 MG/ML IJ SOLN: 12.5000 mg | INTRAMUSCULAR | Status: DC | PRN

## 2020-12-16 MED ORDER — PROMETHAZINE HCL 25 MG/ML IJ SOLN
12.5000 mg | Freq: Four times a day (QID) | INTRAMUSCULAR | Status: DC | PRN
  Administered 2020-12-16: 12.5 mg via INTRAVENOUS
  Filled 2020-12-16: qty 1

## 2020-12-16 MED ORDER — FENTANYL-BUPIVACAINE-NACL 0.5-0.125-0.9 MG/250ML-% EP SOLN
12.0000 mL/h | EPIDURAL | Status: DC | PRN
  Filled 2020-12-16: qty 250

## 2020-12-16 MED ORDER — PRENATAL MULTIVITAMIN CH
1.0000 | ORAL_TABLET | Freq: Every day | ORAL | Status: DC
  Administered 2020-12-17 – 2020-12-18 (×2): 1 via ORAL
  Filled 2020-12-16 (×2): qty 1

## 2020-12-16 MED ORDER — TERBUTALINE SULFATE 1 MG/ML IJ SOLN: 0.2500 mg | Freq: Once | INTRAMUSCULAR | Status: DC | PRN

## 2020-12-16 MED ORDER — FENTANYL CITRATE (PF) 100 MCG/2ML IJ SOLN
INTRAMUSCULAR | Status: DC | PRN
  Administered 2020-12-16 (×2): 50 ug via INTRAVENOUS

## 2020-12-16 MED ORDER — ONDANSETRON HCL 4 MG/2ML IJ SOLN: 4.0000 mg | Freq: Three times a day (TID) | INTRAMUSCULAR | Status: DC | PRN

## 2020-12-16 MED ORDER — LACTATED RINGERS IV SOLN: 500.0000 mL | Freq: Once | INTRAVENOUS | Status: DC

## 2020-12-16 MED ORDER — KETOROLAC TROMETHAMINE 30 MG/ML IJ SOLN
30.0000 mg | Freq: Four times a day (QID) | INTRAMUSCULAR | Status: AC | PRN
  Administered 2020-12-16 – 2020-12-17 (×2): 30 mg via INTRAVENOUS
  Filled 2020-12-16: qty 1

## 2020-12-16 MED ORDER — LIDOCAINE HCL (PF) 1 % IJ SOLN
INTRAMUSCULAR | Status: DC | PRN
  Administered 2020-12-16 (×2): 4 mL via EPIDURAL

## 2020-12-16 MED ORDER — WITCH HAZEL-GLYCERIN EX PADS: 1.0000 "application " | MEDICATED_PAD | CUTANEOUS | Status: DC | PRN

## 2020-12-16 MED ORDER — DIBUCAINE (PERIANAL) 1 % EX OINT: 1.0000 "application " | TOPICAL_OINTMENT | CUTANEOUS | Status: DC | PRN

## 2020-12-16 MED ORDER — LIDOCAINE HCL (PF) 1 % IJ SOLN: 30.0000 mL | INTRAMUSCULAR | Status: DC | PRN

## 2020-12-16 MED ORDER — TETANUS-DIPHTH-ACELL PERTUSSIS 5-2.5-18.5 LF-MCG/0.5 IM SUSY: 0.5000 mL | PREFILLED_SYRINGE | Freq: Once | INTRAMUSCULAR | Status: DC

## 2020-12-16 MED ORDER — LIDOCAINE HCL (PF) 2 % IJ SOLN
INTRAMUSCULAR | Status: AC
  Filled 2020-12-16: qty 15

## 2020-12-16 MED ORDER — EPHEDRINE 5 MG/ML INJ: 10.0000 mg | INTRAVENOUS | Status: DC | PRN

## 2020-12-16 MED ORDER — TRANEXAMIC ACID-NACL 1000-0.7 MG/100ML-% IV SOLN
INTRAVENOUS | Status: AC
  Filled 2020-12-16: qty 100

## 2020-12-16 MED ORDER — OXYTOCIN-SODIUM CHLORIDE 30-0.9 UT/500ML-% IV SOLN
2.5000 [IU]/h | INTRAVENOUS | Status: AC
  Administered 2020-12-16 – 2020-12-17 (×2): 2.5 [IU]/h via INTRAVENOUS
  Filled 2020-12-16: qty 500

## 2020-12-16 MED ORDER — LACTATED RINGERS IV SOLN: INTRAVENOUS | Status: DC

## 2020-12-16 MED ORDER — EPHEDRINE 5 MG/ML INJ
10.0000 mg | INTRAVENOUS | Status: DC | PRN
  Filled 2020-12-16: qty 10

## 2020-12-16 MED ORDER — MENTHOL 3 MG MT LOZG: 1.0000 | LOZENGE | OROMUCOSAL | Status: DC | PRN

## 2020-12-16 MED ORDER — ACETAMINOPHEN 500 MG PO TABS
1000.0000 mg | ORAL_TABLET | Freq: Four times a day (QID) | ORAL | Status: DC
  Administered 2020-12-17 – 2020-12-19 (×9): 1000 mg via ORAL
  Filled 2020-12-16 (×10): qty 2

## 2020-12-16 MED ORDER — SODIUM BICARBONATE 8.4 % IV SOLN
INTRAVENOUS | Status: DC | PRN
  Administered 2020-12-16: 20 mL via EPIDURAL

## 2020-12-16 MED ORDER — CEFAZOLIN SODIUM-DEXTROSE 2-3 GM-%(50ML) IV SOLR
INTRAVENOUS | Status: DC | PRN
  Administered 2020-12-16: 2 g via INTRAVENOUS

## 2020-12-16 MED ORDER — MORPHINE SULFATE (PF) 0.5 MG/ML IJ SOLN
INTRAMUSCULAR | Status: AC
  Filled 2020-12-16: qty 10

## 2020-12-16 MED ORDER — NALOXONE HCL 0.4 MG/ML IJ SOLN: 0.4000 mg | INTRAMUSCULAR | Status: DC | PRN

## 2020-12-16 MED ORDER — OXYCODONE-ACETAMINOPHEN 5-325 MG PO TABS: 1.0000 | ORAL_TABLET | ORAL | Status: DC | PRN

## 2020-12-16 MED ORDER — OXYTOCIN-SODIUM CHLORIDE 30-0.9 UT/500ML-% IV SOLN
INTRAVENOUS | Status: DC | PRN
  Administered 2020-12-16 (×2): 30 [IU] via INTRAVENOUS

## 2020-12-16 MED ORDER — OXYTOCIN BOLUS FROM INFUSION: 333.0000 mL | Freq: Once | INTRAVENOUS | Status: DC

## 2020-12-16 MED ORDER — ONDANSETRON HCL 4 MG/2ML IJ SOLN
INTRAMUSCULAR | Status: DC | PRN
  Administered 2020-12-16: 4 mg via INTRAVENOUS

## 2020-12-16 MED ORDER — MORPHINE SULFATE (PF) 0.5 MG/ML IJ SOLN
INTRAMUSCULAR | Status: DC | PRN
  Administered 2020-12-16: 2 mg via EPIDURAL
  Administered 2020-12-16: 3 mg via EPIDURAL

## 2020-12-16 MED ORDER — ONDANSETRON HCL 4 MG/2ML IJ SOLN
INTRAMUSCULAR | Status: AC
  Filled 2020-12-16: qty 2

## 2020-12-16 MED ORDER — ALBUMIN HUMAN 5 % IV SOLN
INTRAVENOUS | Status: AC
  Filled 2020-12-16: qty 250

## 2020-12-16 SURGICAL SUPPLY — 38 items
APL SKNCLS STERI-STRIP NONHPOA (GAUZE/BANDAGES/DRESSINGS) ×1
BENZOIN TINCTURE PRP APPL 2/3 (GAUZE/BANDAGES/DRESSINGS) ×1 IMPLANT
CHLORAPREP W/TINT 26ML (MISCELLANEOUS) ×2 IMPLANT
CLAMP CORD UMBIL (MISCELLANEOUS) IMPLANT
CLOTH BEACON ORANGE TIMEOUT ST (SAFETY) ×2 IMPLANT
DRSG OPSITE POSTOP 4X10 (GAUZE/BANDAGES/DRESSINGS) ×2 IMPLANT
ELECT REM PT RETURN 9FT ADLT (ELECTROSURGICAL) ×2
ELECTRODE REM PT RTRN 9FT ADLT (ELECTROSURGICAL) ×1 IMPLANT
EXTRACTOR VACUUM KIWI (MISCELLANEOUS) IMPLANT
GLOVE BIO SURGEON STRL SZ 6.5 (GLOVE) ×2 IMPLANT
GLOVE BIOGEL PI IND STRL 7.0 (GLOVE) ×1 IMPLANT
GLOVE BIOGEL PI INDICATOR 7.0 (GLOVE) ×1
GOWN STRL REUS W/TWL LRG LVL3 (GOWN DISPOSABLE) ×4 IMPLANT
KIT ABG SYR 3ML LUER SLIP (SYRINGE) IMPLANT
NDL HYPO 25X5/8 SAFETYGLIDE (NEEDLE) IMPLANT
NEEDLE HYPO 25X5/8 SAFETYGLIDE (NEEDLE) IMPLANT
NS IRRIG 1000ML POUR BTL (IV SOLUTION) ×2 IMPLANT
PACK C SECTION WH (CUSTOM PROCEDURE TRAY) ×2 IMPLANT
PAD OB MATERNITY 4.3X12.25 (PERSONAL CARE ITEMS) ×2 IMPLANT
PENCIL SMOKE EVAC W/HOLSTER (ELECTROSURGICAL) ×2 IMPLANT
RTRCTR C-SECT PINK 25CM LRG (MISCELLANEOUS) ×2 IMPLANT
SPONGE LAP 18X18 RF (DISPOSABLE) ×2 IMPLANT
STRIP CLOSURE SKIN 1/2X4 (GAUZE/BANDAGES/DRESSINGS) ×1 IMPLANT
SUT CHROMIC 1 CTX 36 (SUTURE) ×5 IMPLANT
SUT PLAIN 0 NONE (SUTURE) IMPLANT
SUT PLAIN 2 0 XLH (SUTURE) ×2 IMPLANT
SUT VIC AB 0 CT1 27 (SUTURE) ×4
SUT VIC AB 0 CT1 27XBRD ANBCTR (SUTURE) ×2 IMPLANT
SUT VIC AB 2-0 CT1 27 (SUTURE) ×2
SUT VIC AB 2-0 CT1 TAPERPNT 27 (SUTURE) ×1 IMPLANT
SUT VIC AB 3-0 CT1 27 (SUTURE)
SUT VIC AB 3-0 CT1 TAPERPNT 27 (SUTURE) IMPLANT
SUT VIC AB 3-0 SH 27 (SUTURE) ×6
SUT VIC AB 3-0 SH 27X BRD (SUTURE) IMPLANT
SUT VIC AB 4-0 KS 27 (SUTURE) ×2 IMPLANT
TOWEL OR 17X24 6PK STRL BLUE (TOWEL DISPOSABLE) ×2 IMPLANT
TRAY FOLEY W/BAG SLVR 14FR LF (SET/KITS/TRAYS/PACK) ×2 IMPLANT
WATER STERILE IRR 1000ML POUR (IV SOLUTION) ×2 IMPLANT

## 2020-12-16 NOTE — Anesthesia Procedure Notes (Signed)

## 2020-12-16 NOTE — H&P (View-Only) (Signed)
Kathryn Terry is a 39 y.o. female [id number] at 30 1/7 weeks (EDD 12/22/2020 by LMP c/w 12 week [place]) presenting for IOL at term.  Prenatal care significant for: 1)  Gestational diabetes mellitus  Well-controlled on novolin N 10 units SQ q hs 10/19/20  2)  Congenital anomaly of fetal kidney    Large right pelvicalyceal dilation from bladder upward and possible small ureterocele, needs f/u postpartum  3)  High risk pregnancy due to history of preterm labor  Preterm deliveries at 30 and 36 weeks (G1 and G3) h/o 18 week loss G2 ROM, declined cerclage  17-P vs vaginal suppository 200mg  daily 16-37 weeks, pt   4)  Uterine leiomyoma  Multiple small fibroids 1-2cm in size  5)  Advanced maternal age gravida  declined genetic testing  6)  Shoulder dystocia - delivered  last pregnancy 8#8oz 3rd degree tear MFM 12/08/20 EFW 54%ile and 7 lb 4 oz vtx  AFI 16  OB History    Gravida  6   Para  3   Term  1   Preterm  2   AB  2   Living  3     SAB  2   IAB  0   Ectopic  0   Multiple      Live Births  3         04-24-2006, 30 wks 1. M, 3lbs 8oz, NSVD 01-23-2007, 18 wks 1. M, NSVD  SROM 05-17-2013, 36 wks 1. F, 5lbs 14oz, NSVD 04-29-2017, 39.2 wks 1. M, 8lbs 8oz, Vaginal Delivery  Past Medical History:  Diagnosis Date  . Asthma    not formally diagnosed  . Celiac disease   . Cyst of ovary   . Fibroid   . GERD (gastroesophageal reflux disease)   . Gestational diabetes   . IBS (irritable bowel syndrome)   . Incompetent cervix   . Migraine   . Scoliosis   . Single umbilical artery    Past Surgical History:  Procedure Laterality Date  . NO PAST SURGERIES     Family History: family history includes Alcohol abuse in her mother; Arthritis in her maternal grandmother; Asthma in her brother, daughter, and son; Dementia in her maternal grandmother; Diabetes in her mother; Hearing loss in her maternal grandmother; Hyperlipidemia in her maternal grandmother; Hypertension in  her maternal grandmother and mother; Stroke in her maternal grandmother. Social History:  reports that she has never smoked. She has never used smokeless tobacco. She reports that she does not drink alcohol and does not use drugs.     Maternal Diabetes: Yes:  Diabetes Type:  Insulin/Medication controlled Genetic Screening: Declined Maternal Ultrasounds/Referrals: Fetal Kidney Anomalies Fetal Ultrasounds or other Referrals:  Referred to Materal Fetal Medicine  Maternal Substance Abuse:  No Significant Maternal Medications:  Meds include: Other: Insulin Significant Maternal Lab Results:  Group B Strep negative Other Comments:  None  Review of Systems  Constitutional: Negative for fever.  Gastrointestinal: Negative for abdominal pain.   Maternal Medical History:  Contractions: Frequency: irregular.   Perceived severity is mild.    Fetal activity: Perceived fetal activity is normal.    Prenatal complications: GDM, AMA, fetal hydronephrosis  Prenatal Complications - Diabetes: gestational. Diabetes is managed by insulin injections.        Last menstrual period 03/17/2020, currently breastfeeding. Maternal Exam:  Uterine Assessment: Contraction strength is mild.  Contraction frequency is irregular.   Abdomen: Patient reports no abdominal tenderness. Fetal presentation: vertex  Introitus: Normal vulva.  Normal vagina.  Pelvis: adequate for delivery.      Physical Exam Cardiovascular:     Rate and Rhythm: Normal rate and regular rhythm.  Pulmonary:     Effort: Pulmonary effort is normal.  Abdominal:     Palpations: Abdomen is soft.  Genitourinary:    General: Normal vulva.  Neurological:     Mental Status: She is alert.  Psychiatric:        Mood and Affect: Mood normal.     Prenatal labs: ABO, Rh:  A positive Antibody:  negative Rubella:  Immne RPR:   NR HBsAg:   Neg HIV:   NR GBS:   Neg One hour GCT 142, three hour GTT abnormal Hgb AA  Assessment/Plan: Pt  for IOL at term with GDM well-controlled on PM insulin.  The baby has l large right pelvicalyceal dilation from bladder upward and possible small ureterocele, so will need postnatal imaging. Plan pitocin and AROM.  SHe has h/o shoulder dystocia last delivery but per MFM [place] this baby is smaller.  We have discussed would not be aggressive with operative vaginal delivery or prolonged pushing. Logan Bores 12/16/2020, 7:43 AM

## 2020-12-16 NOTE — H&P (Signed)
Kathryn Terry is a 39 y.o. female [id number] at 30 1/7 weeks (EDD 12/22/2020 by LMP c/w 12 week [place]) presenting for IOL at term.  Prenatal care significant for: 1)  Gestational diabetes mellitus  Well-controlled on novolin N 10 units SQ q hs 10/19/20  2)  Congenital anomaly of fetal kidney    Large right pelvicalyceal dilation from bladder upward and possible small ureterocele, needs f/u postpartum  3)  High risk pregnancy due to history of preterm labor  Preterm deliveries at 30 and 36 weeks (G1 and G3) h/o 18 week loss G2 ROM, declined cerclage  17-P vs vaginal suppository 200mg  daily 16-37 weeks, pt   4)  Uterine leiomyoma  Multiple small fibroids 1-2cm in size  5)  Advanced maternal age gravida  declined genetic testing  6)  Shoulder dystocia - delivered  last pregnancy 8#8oz 3rd degree tear MFM 12/08/20 EFW 54%ile and 7 lb 4 oz vtx  AFI 16  OB History    Gravida  6   Para  3   Term  1   Preterm  2   AB  2   Living  3     SAB  2   IAB  0   Ectopic  0   Multiple      Live Births  3         04-24-2006, 30 wks 1. M, 3lbs 8oz, NSVD 01-23-2007, 18 wks 1. M, NSVD  SROM 05-17-2013, 36 wks 1. F, 5lbs 14oz, NSVD 04-29-2017, 39.2 wks 1. M, 8lbs 8oz, Vaginal Delivery  Past Medical History:  Diagnosis Date  . Asthma    not formally diagnosed  . Celiac disease   . Cyst of ovary   . Fibroid   . GERD (gastroesophageal reflux disease)   . Gestational diabetes   . IBS (irritable bowel syndrome)   . Incompetent cervix   . Migraine   . Scoliosis   . Single umbilical artery    Past Surgical History:  Procedure Laterality Date  . NO PAST SURGERIES     Family History: family history includes Alcohol abuse in her mother; Arthritis in her maternal grandmother; Asthma in her brother, daughter, and son; Dementia in her maternal grandmother; Diabetes in her mother; Hearing loss in her maternal grandmother; Hyperlipidemia in her maternal grandmother; Hypertension in  her maternal grandmother and mother; Stroke in her maternal grandmother. Social History:  reports that she has never smoked. She has never used smokeless tobacco. She reports that she does not drink alcohol and does not use drugs.     Maternal Diabetes: Yes:  Diabetes Type:  Insulin/Medication controlled Genetic Screening: Declined Maternal Ultrasounds/Referrals: Fetal Kidney Anomalies Fetal Ultrasounds or other Referrals:  Referred to Materal Fetal Medicine  Maternal Substance Abuse:  No Significant Maternal Medications:  Meds include: Other: Insulin Significant Maternal Lab Results:  Group B Strep negative Other Comments:  None  Review of Systems  Constitutional: Negative for fever.  Gastrointestinal: Negative for abdominal pain.   Maternal Medical History:  Contractions: Frequency: irregular.   Perceived severity is mild.    Fetal activity: Perceived fetal activity is normal.    Prenatal complications: GDM, AMA, fetal hydronephrosis  Prenatal Complications - Diabetes: gestational. Diabetes is managed by insulin injections.        Last menstrual period 03/17/2020, currently breastfeeding. Maternal Exam:  Uterine Assessment: Contraction strength is mild.  Contraction frequency is irregular.   Abdomen: Patient reports no abdominal tenderness. Fetal presentation: vertex  Introitus: Normal vulva.  Normal vagina.  Pelvis: adequate for delivery.      Physical Exam Cardiovascular:     Rate and Rhythm: Normal rate and regular rhythm.  Pulmonary:     Effort: Pulmonary effort is normal.  Abdominal:     Palpations: Abdomen is soft.  Genitourinary:    General: Normal vulva.  Neurological:     Mental Status: She is alert.  Psychiatric:        Mood and Affect: Mood normal.     Prenatal labs: ABO, Rh:  A positive Antibody:  negative Rubella:  Immne RPR:   NR HBsAg:   Neg HIV:   NR GBS:   Neg One hour GCT 142, three hour GTT abnormal Hgb AA  Assessment/Plan: Pt  for IOL at term with GDM well-controlled on PM insulin.  The baby has l large right pelvicalyceal dilation from bladder upward and possible small ureterocele, so will need postnatal imaging. Plan pitocin and AROM.  SHe has h/o shoulder dystocia last delivery but per MFM [place] this baby is smaller.  We have discussed would not be aggressive with operative vaginal delivery or prolonged pushing. Logan Bores 12/16/2020, 7:43 AM

## 2020-12-16 NOTE — Progress Notes (Signed)
Patient ID: Kathryn Terry, female   DOB: 1981/07/15, 39 y.o.   MRN: [id number] Pt received epidural and now comfortable  afeb VSS  FHR category 1  4/70/-2 AROM clear  FSE applied and FHR was reading 90 but seemed to be maternal HR.  FSE itself changed to confirm on vertex, then cords changed but could not get the FSE to read consistently.  Bedside US confirmed FHR was normal at 130-140 and FSE was not reading accurately.  Suspect the console is bad.  External monitor reading fine so left in place and nursing will assess if console bad.   Follow progress, increase pitocin until adequate MVU's

## 2020-12-16 NOTE — Anesthesia Preprocedure Evaluation (Addendum)
Anesthesia Evaluation  Patient identified by MRN, date of birth, ID band Patient awake    Reviewed: Allergy & Precautions, Patient's Chart, lab work & pertinent test results  Airway Mallampati: II  TM Distance: >3 FB Neck ROM: Full    Dental no notable dental hx. (+) Teeth Intact   Pulmonary asthma ,    Pulmonary exam normal breath sounds clear to auscultation       Cardiovascular negative cardio ROS Normal cardiovascular exam Rhythm:Regular Rate:Normal     Neuro/Psych  Headaches, negative psych ROS   GI/Hepatic Neg liver ROS, GERD  Medicated,Celiac disease   Endo/Other  diabetes, Well Controlled, Gestational, Insulin DependentObesity  Renal/GU negative Renal ROS  negative genitourinary   Musculoskeletal Scoliosis   Abdominal (+) + obese,   Peds  Hematology  (+) anemia ,   Anesthesia Other Findings   Reproductive/Obstetrics (+) Pregnancy                             Anesthesia Physical Anesthesia Plan  ASA: II and emergent  Anesthesia Plan: Epidural   Post-op Pain Management:    Induction:   PONV Risk Score and Plan: 4 or greater and Scopolamine patch - Pre-op, Ondansetron and Treatment may vary due to age or medical condition  Airway Management Planned: Natural Airway  Additional Equipment:   Intra-op Plan:   Post-operative Plan:   Informed Consent: I have reviewed the patients History and Physical, chart, labs and discussed the procedure including the risks, benefits and alternatives for the proposed anesthesia with the patient or authorized representative who has indicated his/her understanding and acceptance.     Dental advisory given  Plan Discussed with: Anesthesiologist and CRNA  Anesthesia Plan Comments: (Patient for urgent C/Section for fetal intolerance to labor and arrest of descent and dilation. Will use epidural for C/Section. M. Malen Gauze, MD)        Anesthesia Quick Evaluation

## 2020-12-16 NOTE — Op Note (Signed)
Operative Note    Preoperative Diagnosis Term pregnancy at 39 1/7 weeks Arrest of descent Prior shoulder dystocia delivery Category 2-3 fetal heart tracing AMA Fetal renal hydronephrosis Gestational diabetes  Postoperative Diagnosis same  Procedure Primary low transverse c-section with extension on the right angle  Surgeon Huel Cote, MD  Anesthesia Epidural  Fluids: EBL UOP IVF LR          albumin  Findings A viable female infant in the vertex presentation, direct OP.  Apgars 8,9 Weight 7#12oz  Uterus enlarged with fibroids and LUS extremely edematous and boggy.   Moderate atony controlled with TXA, ptiocin and massage.   Normal ovaries and tubes  Specimen Placenta to L&D  Procedure Note   Patient was taken to the operating room where epidural anesthesia was found to be adequate by Allis clamp test. She was prepped and draped in the normal sterile fashion in the dorsal supine position with a leftward tilt. An appropriate time out was performed. A Pfannenstiel skin incision was then made with the scalpel and carried through to the underlying layer of fascia by sharp dissection. The fascia was nicked in the midline and the incision was extended laterally with Mayo scissors. The inferior aspect of the incision was grasped Coker clamps and dissected off the underlying rectus muscles. In a similar fashion the superior aspect was dissected off the rectus muscles. Rectus muscles were separated in the midline and the peritoneal cavity entered bluntly. The peritoneal incision was then extended both superiorly and inferiorly with careful attention to avoid both bowel and bladder. The Alexis self-retaining wound retractor was then placed within the incision and the lower uterine segment exposed. The bladder flap was developed with Metzenbaum scissors and pushed away from the lower uterine segment. The lower uterine segment was then incised in a transverse  fashion and the cavity itself entered bluntly. The incision was extended bluntly. The infant's head was then lifted and delivered from the incision without difficulty. The remainder of the infant delivered and the nose and mouth bulb suctioned with the cord clamped and cut as well. The infant was handed off to the waiting pediatricians.  There was some brisk bleeding after delivery of the placenta from atony and the LUS which was edematous.   The placenta was then spontaneously expressed from the uterus and the uterus cleared of all clots and debris with moist lap sponge. The uterine incision was then repaired in 2 layers the first layer was a running locked layer 1-0 chromic and the second an imbricating layer of the same suture. There was a superficial 3 cm extension of the right angle which was repeaired with 3-0 vicryl for hemostasis.  The tubes and ovaries were inspected and the gutters cleared of all clots and debris. The uterine incision was inspected and found to be hemostatic. All instruments and sponges as well as the Alexis retractor were then removed from the abdomen. The rectus muscles and peritoneum were then reapproximated with a running suture of 2-0 Vicryl. The fascia was then closed with 0 Vicryl in a running fashion. Subcutaneous tissue was reapproximated with 3-0 plain in a running fashion. The skin was closed with a subcuticular stitch of 4-0 Vicryl on a Keith needle and then reinforced with benzoin and Steri-Strips. At the conclusion of the procedure all instruments and sponge counts were correct. Patient was taken to the recovery room in good condition with her baby accompanying her skin to skin.

## 2020-12-16 NOTE — Interval H&P Note (Signed)
History and Physical Interval Note:  12/16/2020 11:49 AM  Kathryn Terry  is admitted for IOL at term.  She has a category 1 tracing and has been feeling contractions overnight. BS good at 80. I d/w her even though the EFW of this baby is predicted to be < 7 1/2 lbs, I think she should get an epidural for delivery in the event of another shoulder dystocia or need for C/S.  Pt is agreeable and will get epidural.  PLan AROM after epidural  Oliver Pila

## 2020-12-16 NOTE — Transfer of Care (Signed)
Immediate Anesthesia Transfer of Care Note  Patient: Kathryn Terry  Procedure(s) Performed: CESAREAN SECTION (N/A )  Patient Location: PACU  Anesthesia Type:Epidural  Level of Consciousness: awake, alert  and oriented  Airway & Oxygen Therapy: Patient Spontanous Breathing  Post-op Assessment: Report given to RN and Post -op Vital signs reviewed and stable  Post vital signs: Reviewed and stable  Last Vitals:  Vitals Value Taken Time  BP 113/63 12/16/20 1800  Temp 36.6 C 12/16/20 1756  Pulse 99 12/16/20 1802  Resp 17 12/16/20 1802  SpO2 98 % 12/16/20 1802  Vitals shown include unvalidated device data.  Last Pain:  Vitals:   12/16/20 1756  TempSrc: Oral  PainSc:          Complications: No complications documented.

## 2020-12-16 NOTE — Progress Notes (Signed)
Patient ID: Kathryn Terry, female   DOB: Feb 01, 1981, 39 y.o.   MRN: [id number] Delayed entry note  At 300pm pt began to have deep variable recurrent decelerations and amnioinfusion and position changes were employed, including knee chest position.  Since the patient was making rapid progress, and the FHR initially was recovering between contractions with good variability, we attempted multiple maneuvers and she did reach 10cm.  The vertex remained OP and high.  We attempting pushing, but the baby did not descend beyond a 0 to +1 station and the FHR began to have more prolonged decelerations.  Given her history of a 3 minute dystocia and the FHR tracing I counseled patient we needed to proceed with c-section. I discussed the procedure and risks/benefits with her.  The OR was informed and we promptly went as soon as the OR was ready.  Of note, the fetal scalp electrode never consistently worked despite multiple efforts to change the parts of it out.

## 2020-12-17 ENCOUNTER — Encounter (HOSPITAL_COMMUNITY): Payer: Self-pay | Admitting: Obstetrics and Gynecology

## 2020-12-17 LAB — CBC
HCT: 22.4 % — ABNORMAL LOW (ref 36.0–46.0)
Hemoglobin: 7.2 g/dL — ABNORMAL LOW (ref 12.0–15.0)
MCH: 28.8 pg (ref 26.0–34.0)
MCHC: 32.1 g/dL (ref 30.0–36.0)
MCV: 89.6 fL (ref 80.0–100.0)
Platelets: 170 10*3/uL (ref 150–400)
RBC: 2.5 MIL/uL — ABNORMAL LOW (ref 3.87–5.11)
RDW: 16.4 % — ABNORMAL HIGH (ref 11.5–15.5)
WBC: 12.1 10*3/uL — ABNORMAL HIGH (ref 4.0–10.5)
nRBC: 0 % (ref 0.0–0.2)

## 2020-12-17 MED ORDER — SODIUM CHLORIDE 0.9 % IV SOLN
500.0000 mg | Freq: Once | INTRAVENOUS | Status: AC
  Administered 2020-12-17: 500 mg via INTRAVENOUS
  Filled 2020-12-17: qty 25

## 2020-12-17 NOTE — Progress Notes (Signed)
Subjective: Postpartum Day 1 Cesarean Delivery Patient reports tolerating PO and no problems voiding.  Pt able to ambulate without dizziness  Objective: Vital signs in last 24 hours: Temp:  [97.7 F (36.5 C)-98.7 F (37.1 C)] 98.5 F (36.9 C) (01/01 0815) Pulse Rate:  [82-110] 94 (01/01 0815) Resp:  [16-22] 16 (01/01 0815) BP: (96-127)/(58-105) 96/61 (01/01 0815) SpO2:  [95 %-100 %] 100 % (01/01 0815) Weight:  [78 kg] 78 kg (12/31 1024)  Physical Exam:  General: alert and cooperative Lochia: appropriate Uterine Fundus: firm Incision: C/D/I   Recent Labs    12/16/20 1030 12/17/20 0434  HGB 10.8* 7.2*  HCT 32.5* 22.4*    Assessment/Plan: Status post Cesarean section. Postoperative course complicated by anemia of acute blood loss and chronic anemia, tolerating thus far but will order IV iron today  Continue current care. Parents desire circumcision but baby has not voided well yet and need to confirm ok with peds given hydronephrosis Oliver Pila 12/17/2020, 9:50 AM

## 2020-12-17 NOTE — Anesthesia Postprocedure Evaluation (Signed)
Anesthesia Post Note  Patient: Kathryn Terry  Procedure(s) Performed: CESAREAN SECTION (N/A )     Patient location during evaluation: PACU Anesthesia Type: Epidural Level of consciousness: oriented and awake and alert Pain management: pain level controlled Vital Signs Assessment: post-procedure vital signs reviewed and stable Respiratory status: spontaneous breathing, respiratory function stable and nonlabored ventilation Cardiovascular status: blood pressure returned to baseline and stable Postop Assessment: no headache, no backache, no apparent nausea or vomiting, epidural receding and patient able to bend at knees Anesthetic complications: no   No complications documented.                  Rhyder Koegel A.

## 2020-12-17 NOTE — Plan of Care (Signed)
  Problem: Activity: Goal: Risk for activity intolerance will decrease Outcome: Completed/Met Note: Patient ambulating well with not dizziness. Discussed the importance of increasing ambulation slowly throughout the day. Maxwell Caul, Leretha Dykes Henry

## 2020-12-17 NOTE — Discharge Summary (Signed)
Postpartum Discharge Summary      Patient Name: Kathryn Terry DOB: 1981-12-14 MRN: [id number]  Date of admission: 12/16/2020 Delivery date:12/16/2020  Delivering provider: Paula Compton  Date of discharge: 12/19/2020  Admitting diagnosis: Gestational diabetes mellitus (GDM) affecting pregnancy [O24.419] S/P primary low transverse C-section [Z98.891] Intrauterine pregnancy: [redacted]w[redacted]d     Secondary diagnosis:  Active Problems:   Gestational diabetes mellitus (GDM) affecting pregnancy   S/P primary low transverse C-section  Additional problems: Prior severe shoulder dystocia   Chronic and acute blood loss anemia Discharge diagnosis: Term Pregnancy Delivered, GDM A2 and Anemia                                              Post partum procedures:venofer iron infusion Augmentation: AROM, Pitocin and Cytotec Complications: Arrest of descent  Hospital course: Induction of Labor With Cesarean Section   40 y.o. yo [phone] at [redacted]w[redacted]d was admitted to the hospital 12/16/2020 for induction of labor. Patient had a labor course significant for arrest of descent and persistent category 2-3 tracing. . The patient went for cesarean section due to Arrest of Descent and fetal heart rate decelerations. Delivery details are as follows: Membrane Rupture Time/Date: 12:29 PM ,12/16/2020   Delivery Method:C-Section, Low Transverse  Details of operation can be found in separate operative Note.  Patient had an uncomplicated postpartum course.  Received IV iron for anemia.   She is ambulating, tolerating a regular diet, passing flatus, and urinating well. She received an iron infusion on pp #1 for her relative anemia. Patient is discharged home in stable condition on 12/19/20.      Newborn Data: Birth date:12/16/2020  Birth time:4:32 PM  Gender:Female  Living status:Living  Apgars:8 ,9  Weight:3535 g                                   Physical exam  Vitals:   12/18/20 0545 12/18/20 1350 12/18/20 2012  12/19/20 0642  BP: 105/72 128/70 110/71 115/72  Pulse: 96 100 (!) 104 (!) 102  Resp:  18 18 18   Temp: 98.7 F (37.1 C) 98 F (36.7 C) 98.1 F (36.7 C) 98 F (36.7 C)  TempSrc: Oral Oral Oral Oral  SpO2: 100%  100% 100%  Weight:      Height:       General: alert and cooperative Lochia: appropriate Uterine Fundus: firm Incision: Dressing is clean, dry, and intact DVT Evaluation: No evidence of DVT seen on physical exam. Labs: Lab Results  Component Value Date   WBC 12.1 (H) 12/18/2020   HGB 7.1 (L) 12/18/2020   HCT 22.4 (L) 12/18/2020   MCV 89.6 12/18/2020   PLT 194 12/18/2020   CMP Latest Ref Rng & Units 01/23/2018  Glucose 70 - 99 mg/dL 75  BUN 6 - 23 mg/dL 11  Creatinine 0.40 - 1.20 mg/dL 0.64  Sodium 135 - 145 mEq/L 138  Potassium 3.5 - 5.1 mEq/L 3.8  Chloride 96 - 112 mEq/L 105  CO2 19 - 32 mEq/L 26  Calcium 8.4 - 10.5 mg/dL 9.3  Total Protein 6.0 - 8.3 g/dL 7.9  Total Bilirubin 0.2 - 1.2 mg/dL 0.4  Alkaline Phos 39 - 117 U/L 84  AST 0 - 37 U/L 10  ALT 0 - 35 U/L 8  Edinburgh Score: Edinburgh Postnatal Depression Scale Screening Tool 12/18/2020  I have been able to laugh and see the funny side of things. 0  I have looked forward with enjoyment to things. 0  I have blamed myself unnecessarily when things went wrong. 1  I have been anxious or worried for no good reason. 0  I have felt scared or panicky for no good reason. 0  Things have been getting on top of me. 0  I have been so unhappy that I have had difficulty sleeping. 0  I have felt sad or miserable. 1  I have been so unhappy that I have been crying. 0  The thought of harming myself has occurred to me. 0  Edinburgh Postnatal Depression Scale Total 2     After visit meds:  Allergies as of 12/19/2020   No Known Allergies     Medication List    STOP taking these medications   MAKENA [place]   NovoLIN N FlexPen 100 UNIT/ML Kiwkpen Generic drug: Insulin NPH (Human) (Isophane)     TAKE these  medications   FUSION PO Take 1 capsule by mouth daily.   ibuprofen 800 MG tablet Commonly known as: ADVIL Take 1 tablet (800 mg total) by mouth every 8 (eight) hours.   oxyCODONE 5 MG immediate release tablet Commonly known as: Oxy IR/ROXICODONE Take 1 tablet (5 mg total) by mouth every 4 (four) hours as needed for severe pain.   PRENATAL AD PO Take by mouth.        Discharge home in stable condition Infant Feeding: Breast Infant Disposition:home with mother Discharge instruction: per After Visit Summary and Postpartum booklet. Activity: Advance as tolerated. Pelvic rest for 6 weeks.  Diet: routine diet Future Appointments:No future appointments. Follow up Visit:  Follow-up Information    Huel Cote, MD. Schedule an appointment as soon as possible for a visit in 2 week(s).   Specialty: Obstetrics and Gynecology Why: incision check Contact information: [address] [place] [address] [phone]                Please schedule this patient for a In person postpartum visit in 2 weeks with the following provider: MD.  Delivery mode:  C-Section, Low Transverse     12/19/2020 Zenaida Niece, MD

## 2020-12-18 LAB — CBC
HCT: 22.4 % — ABNORMAL LOW (ref 36.0–46.0)
Hemoglobin: 7.1 g/dL — ABNORMAL LOW (ref 12.0–15.0)
MCH: 28.4 pg (ref 26.0–34.0)
MCHC: 31.7 g/dL (ref 30.0–36.0)
MCV: 89.6 fL (ref 80.0–100.0)
Platelets: 194 10*3/uL (ref 150–400)
RBC: 2.5 MIL/uL — ABNORMAL LOW (ref 3.87–5.11)
RDW: 17.1 % — ABNORMAL HIGH (ref 11.5–15.5)
WBC: 12.1 10*3/uL — ABNORMAL HIGH (ref 4.0–10.5)
nRBC: 0 % (ref 0.0–0.2)

## 2020-12-18 NOTE — Plan of Care (Signed)
  Problem: Pain Managment: Goal: General experience of comfort will improve Note: Patient has only requested scheduled pain medications today. She states that she is much more comfortable when she is up moving around in the room and sitting in the chair as opposed to lying in the bed and getting in and out of the bed. Patient is aware of her prn pain medication and has been instructed to call if she decides that she needs more than the tylenol and motrin. Earl Gala, Linda Hedges Plum

## 2020-12-18 NOTE — Progress Notes (Signed)
Subjective: Postpartum Day 2: Cesarean Delivery Patient reports incisional pain, tolerating PO and no problems voiding.  Ambulating well.  Objective: Vital signs in last 24 hours: Temp:  [98.1 F (36.7 C)-98.7 F (37.1 C)] 98.7 F (37.1 C) (01/02 0545) Pulse Rate:  [90-96] 96 (01/02 0545) Resp:  [18] 18 (01/01 2024) BP: (105-112)/(69-73) 105/72 (01/02 0545) SpO2:  [100 %] 100 % (01/02 0545)  Physical Exam:  General: alert and cooperative Lochia: appropriate Uterine Fundus: firm,  Incision: C/D/I   Recent Labs    12/17/20 0434 12/18/20 0521  HGB 7.2* 7.1*  HCT 22.4* 22.4*    Assessment/Plan: Status post Cesarean section. Doing well postoperatively. Received venofer and hgb stable this AM. Peds d/w pediatric urology and baby clear for circumcision so will do this AM.  Needs to stay until tomorrow for bili watch and renal US at 48 hours this PM, so pt will stay until tomorrow.  Kathryn Terry 12/18/2020, 8:38 AM

## 2020-12-19 MED ORDER — OXYCODONE HCL 5 MG PO TABS: 5.0000 mg | ORAL_TABLET | ORAL | 0 refills | Status: AC | PRN

## 2020-12-19 MED ORDER — IBUPROFEN 800 MG PO TABS: 800.0000 mg | ORAL_TABLET | Freq: Three times a day (TID) | ORAL | 0 refills | Status: AC

## 2020-12-19 NOTE — Progress Notes (Signed)
POD #3 LTCS Doing well, + flatus but no BM yet Afeb, VSS Abd- soft, mild distension, fundus firm, incision intact D/c home today

## 2021-01-12 IMAGING — US US MFM FETAL BPP W/O NON-STRESS
1 series · 13 of 28 positions shown · non-contrast
Comparison: none

[Series 1: us mfm fetal bpp w/o non-stress · 76 acquisitions, 13 frames shown]
[im 3/76]
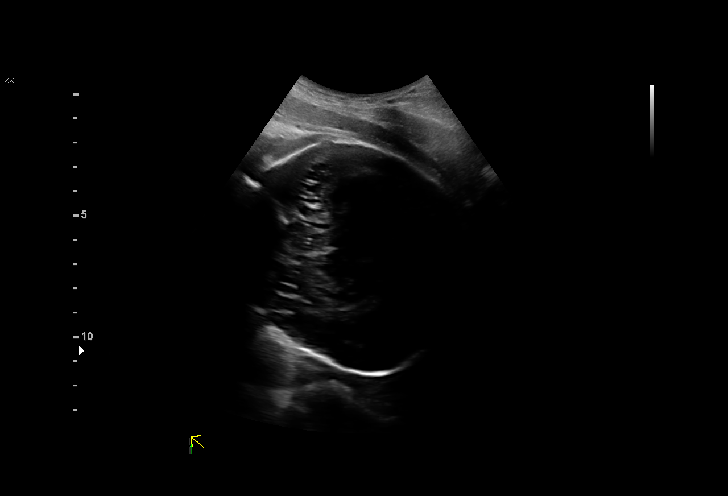
[im 9/76]
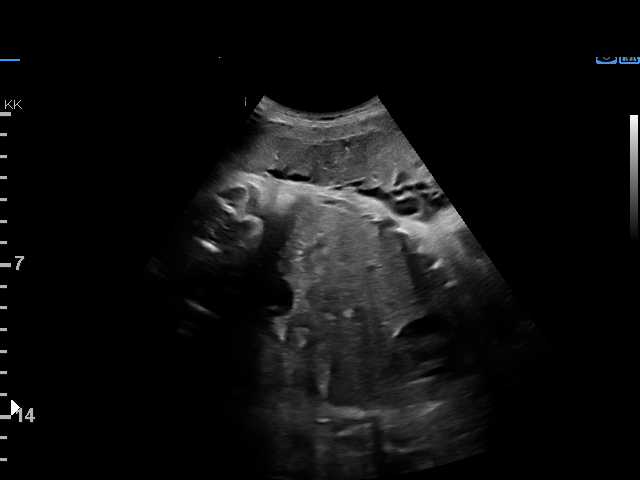
[im 14/76]
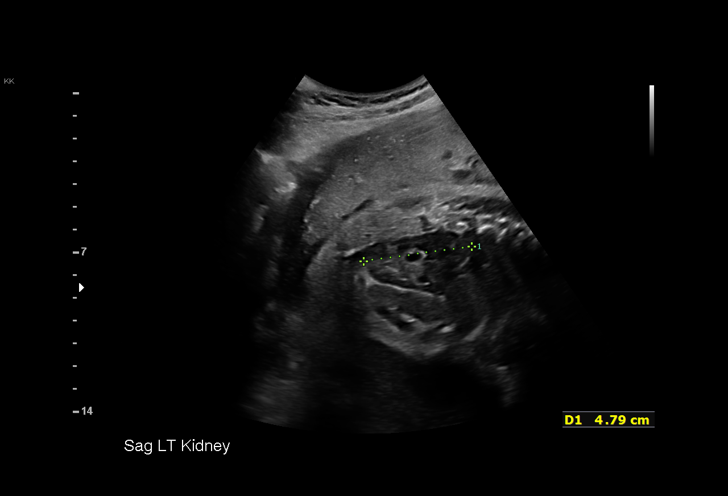
[im 20/76]
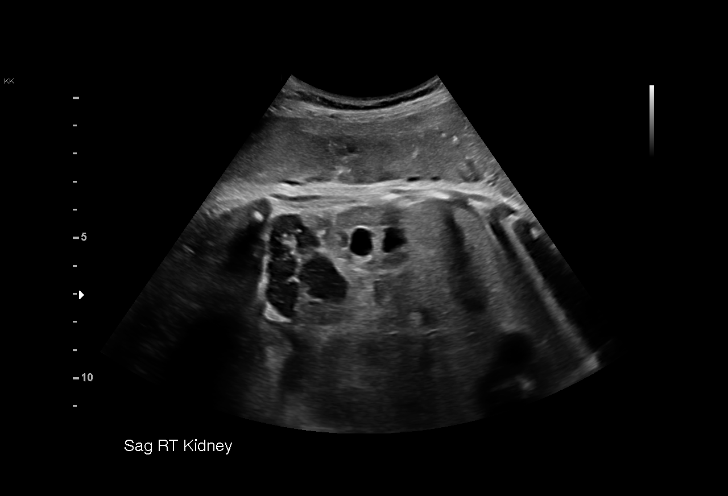
[im 26/76]
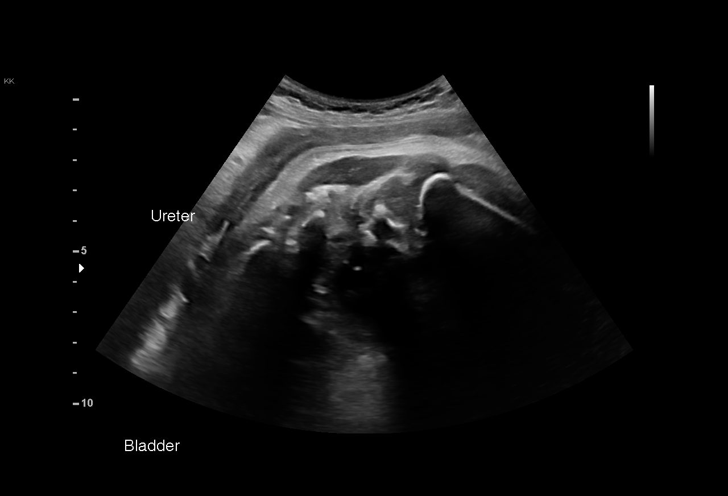
[im 31/76]
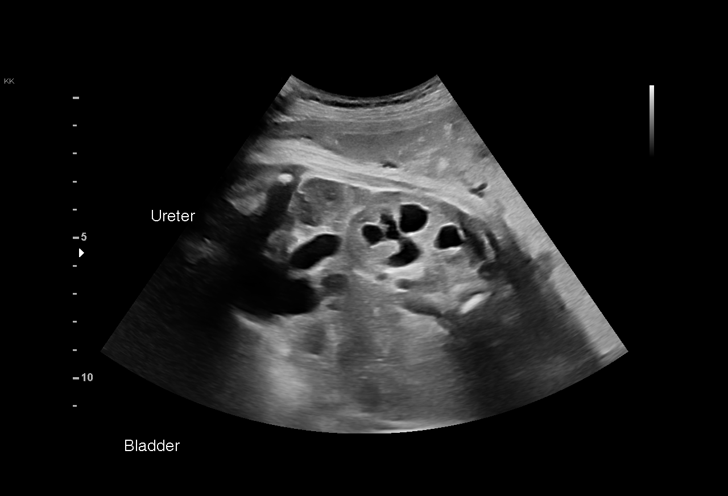
[im 39/76]
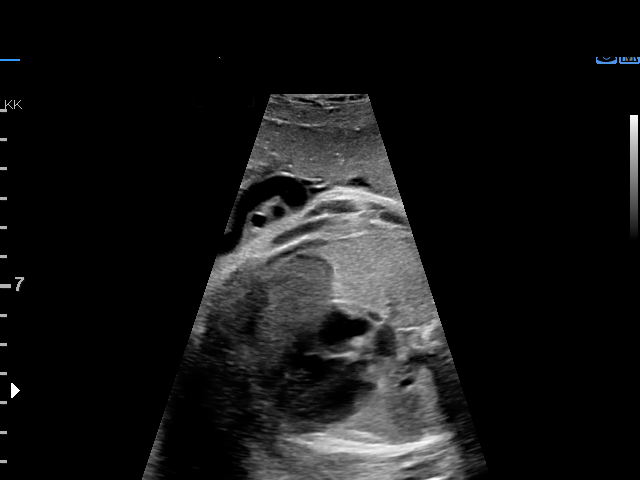
[im 45/76]
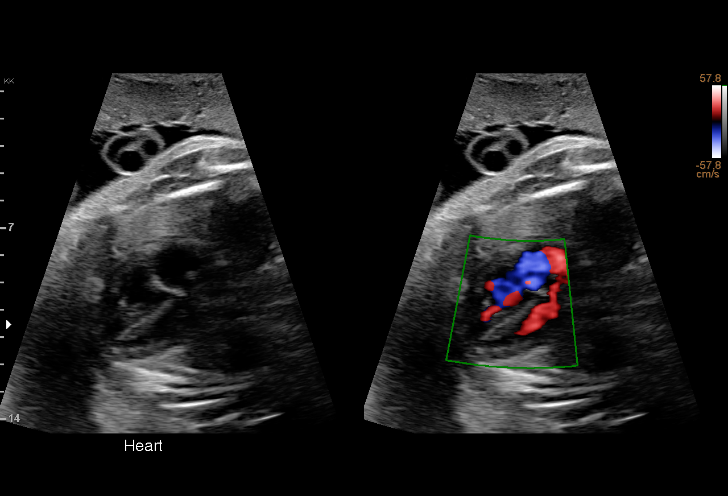
[im 51/76]
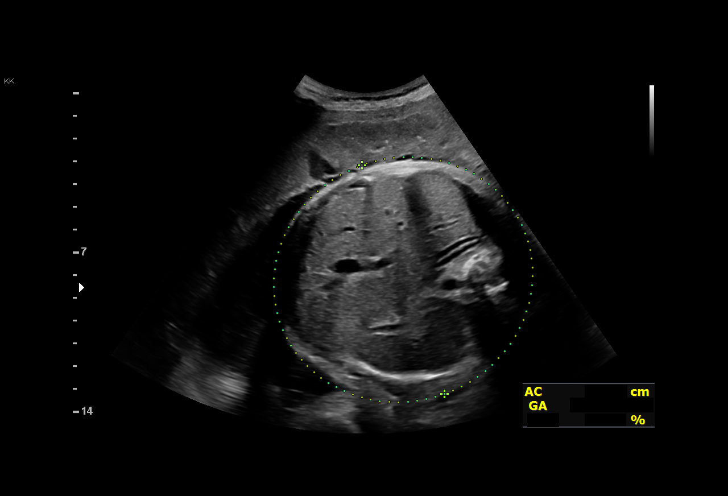
[im 56/76]
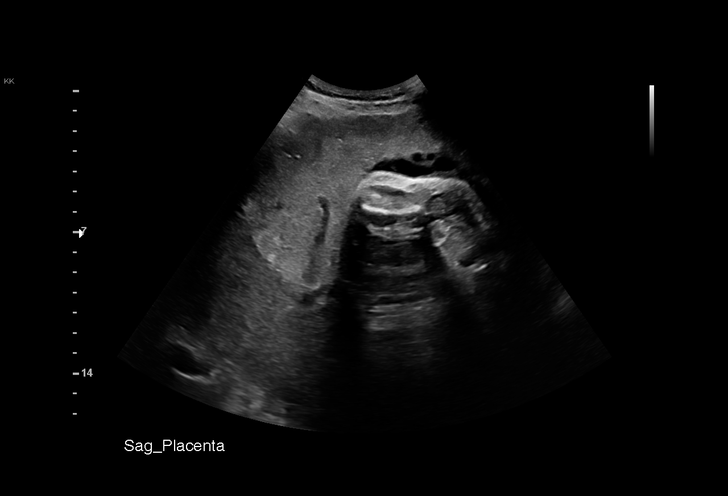
[im 62/76]
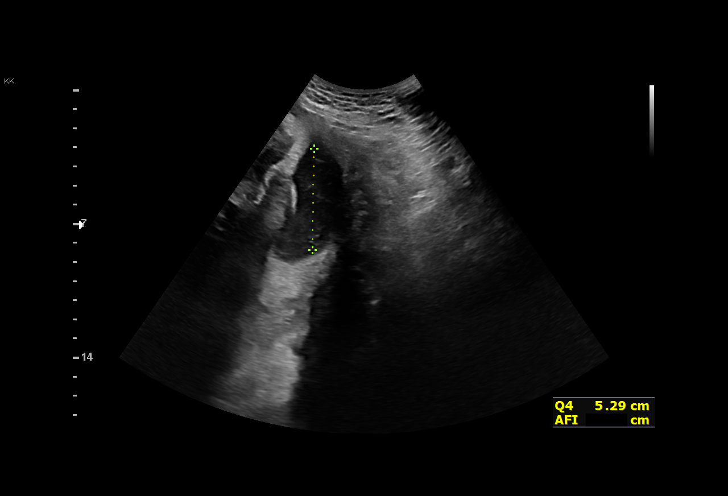
[im 67/76]
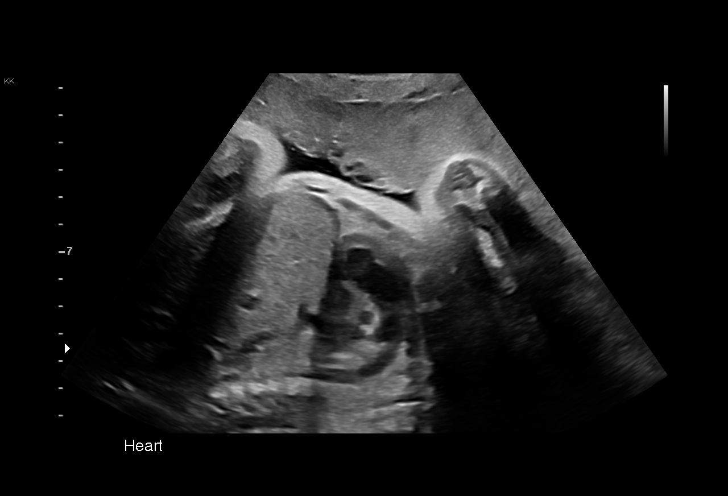
[im 73/76]
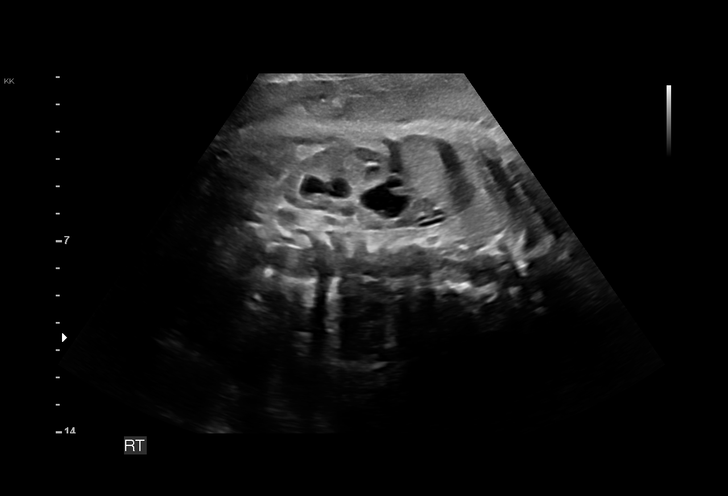

[13 of 28 positions shown; findings below may reference images not displayed]

[HOSPITAL],
                                                            Inc.

Indications

 Gestational diabetes in pregnancy, insulin
 controlled
 Prior poor obstetrical history antepartum,
 third trimester (pre-term at 30 weeks,
 incompetent cervix)
 Unspecified pre-existing hypertension
 complicating pregnancy, third trimester
 Uterine fibroids
 Advanced maternal age multigravida 35+,
 third trimester
 Poor obstetrical history (History of Shoulder
 Dystocia)
 38 weeks gestation of pregnancy
Fetal Evaluation

 Num Of Fetuses:         1
 Fetal Heart Rate(bpm):  135
 Cardiac Activity:       Observed
 Presentation:           Cephalic
 Placenta:               Anterior
 P. Cord Insertion:      Previously Visualized
 Amniotic Fluid
 AFI FV:      Within normal limits

 AFI Sum(cm)     %Tile       Largest Pocket(cm)
 16.38           63

 RUQ(cm)       RLQ(cm)       LUQ(cm)        LLQ(cm)

Biophysical Evaluation

 Amniotic F.V:   Pocket => 2 cm             F. Tone:        Observed
 F. Movement:    Observed                   Score:          [DATE]
 F. Breathing:   Observed
Biometry

 BPD:        86  mm     G. Age:  34w 5d        3.2  %    CI:        72.17   %    70 - 86
                                                         FL/HC:      22.8   %    20.9 -
 HC:      322.1  mm     G. Age:  36w 3d          5  %    HC/AC:      0.92        0.92 -
 AC:      348.5  mm     G. Age:  38w 5d         85  %    FL/BPD:     85.3   %    71 - 87
 FL:       73.4  mm     G. Age:  37w 4d         41  %    FL/AC:      21.1   %    20 - 24

 Est. FW:    1040  gm      7 lb 4 oz     54  %
OB History

 Gravidity:    6
 Living:       3
Gestational Age

 LMP:           38w 0d        Date:  03/17/20                 EDD:   12/22/20
 U/S Today:     36w 6d                                        EDD:   12/30/20
 Best:          38w 0d     Det. By:  LMP  (03/17/20)          EDD:   12/22/20
Anatomy

 Cranium:               Appears normal         Aortic Arch:            Not well visualized
 Cavum:                 Appears normal         Ductal Arch:            Appears normal
 Ventricles:            Appears normal         Diaphragm:              Previously seen
 Choroid Plexus:        Not well visualized    Stomach:                Appears normal, left
                                                                       sided
 Cerebellum:            Previously seen        Abdomen:                Appears normal
 Posterior Fossa:       Previously seen        Abdominal Wall:         Not well visualized
 Nuchal Fold:           Not applicable (>20    Cord Vessels:           Previously seen
                        wks GA)
 Face:                  Not well visualized    Kidneys:                Hydronephrosis Rt
                                                                       Kidney
 Lips:                  Not well visualized    Bladder:                Appears normal
 Thoracic:              Appears normal         Spine:                  Limited views
                                                                       previously seen
 Heart:                 Previously seen        Upper Extremities:      Previously seen
 RVOT:                  Appears normal         Lower Extremities:      Previously seen
 LVOT:                  Appears normal

 Other:  Hands, feet, and Heels previously visualized. Fetus appears to be a
         male.
Cervix Uterus Adnexa

 Cervix
 Not visualized (advanced GA >94wks)
Impression

 Gestational diabetes.  Patient takes insulin for control.  She
 reports most of her her blood glucose levels are now within
 normal range.  She has not brought her logbook.  Right fetal
 hydronephrosis was seen on previous scans.
 Obstetric history significant for 3 vaginal deliveries.  Her most
 recent delivery in 1156 was complicated by shoulder
 dystocia.  The infant did not sustain any neurological injuries.

 On today's ultrasound, fetal growth is appropriate for
 gestational age.  Amniotic fluid is normal and good fetal
 activity seen.  Antenatal testing is reassuring.  BPP [DATE].
 Large pelvicalyceal dilation was seen. The entire ureter was
 dilated from bladder upward and there is an apprearance of
 small ureterocele in the bladder.
 I explained the possibility of vesicoureteric obstruction that
 will be confirmed only in postnatal life.

 Patient reports she will be undergoing induction of labor on
 12/16/2020.  I counseled her on shoulder dystocia and
 recurrence in about 15% of cases.  Patient desires vaginal
 delivery.
Recommendations

 -No follow-up appointments were made.
 -If diabetes is not well controlled or if the patient experiences
 decreased fetal movement symptoms, consider delivery.
 -Neonatologist to be informed of hydronephrosis at delivery.
                      Senhaji, Nordin

## 2024-02-07 ENCOUNTER — Other Ambulatory Visit: Payer: Self-pay | Admitting: Obstetrics and Gynecology

## 2024-02-07 ENCOUNTER — Encounter: Payer: Self-pay | Admitting: Obstetrics and Gynecology

## 2024-02-07 DIAGNOSIS — R928 Other abnormal and inconclusive findings on diagnostic imaging of breast: Secondary | ICD-10-CM

## 2024-02-19 ENCOUNTER — Ambulatory Visit
Admission: RE | Admit: 2024-02-19 | Discharge: 2024-02-19 | Disposition: A | Discharge status: Home / Self Care | Payer: Self-pay | Source: Ambulatory Visit | Attending: Obstetrics and Gynecology | Admitting: Obstetrics and Gynecology

## 2024-02-19 ENCOUNTER — Other Ambulatory Visit: Payer: Self-pay | Admitting: Obstetrics and Gynecology

## 2024-02-19 DIAGNOSIS — R928 Other abnormal and inconclusive findings on diagnostic imaging of breast: Secondary | ICD-10-CM

## 2024-03-04 ENCOUNTER — Other Ambulatory Visit: Payer: Self-pay | Admitting: Obstetrics and Gynecology

## 2024-03-04 DIAGNOSIS — N6320 Unspecified lump in the left breast, unspecified quadrant: Secondary | ICD-10-CM

## 2024-03-05 ENCOUNTER — Ambulatory Visit
Admission: RE | Admit: 2024-03-05 | Discharge: 2024-03-05 | Disposition: A | Discharge status: Home / Self Care | Source: Ambulatory Visit | Attending: Obstetrics and Gynecology | Admitting: Obstetrics and Gynecology

## 2024-03-05 DIAGNOSIS — N6320 Unspecified lump in the left breast, unspecified quadrant: Secondary | ICD-10-CM
